# Patient Record
Sex: Female | Born: 1985 | Race: Black or African American | Hispanic: No | Marital: Single | Smoking: Never smoker
Health system: Southern US, Community
[De-identification: ages and names within clinical notes are randomized; demographics above are authoritative.]

## PROBLEM LIST (undated history)

## (undated) HISTORY — PX: FOOT SURGERY: SHX648

## (undated) HISTORY — PX: BREAST BIOPSY: SHX20

## (undated) HISTORY — PX: TONSILLECTOMY: SUR1361

---

## 2004-12-26 ENCOUNTER — Ambulatory Visit (HOSPITAL_COMMUNITY): Admission: RE | Admit: 2004-12-26 | Discharge: 2004-12-26 | Payer: Self-pay | Admitting: Obstetrics and Gynecology

## 2004-12-26 IMAGING — US US OB COMP +14 WK
1 series · 13 of 28 positions shown · non-contrast
Comparison: none

CLINICAL DATA: Anatomic exam.  No current problems.

[Series 1: us ob comp +14 wk · 0.33mm/px · 13 of 72 slices shown]
[im 3/72]
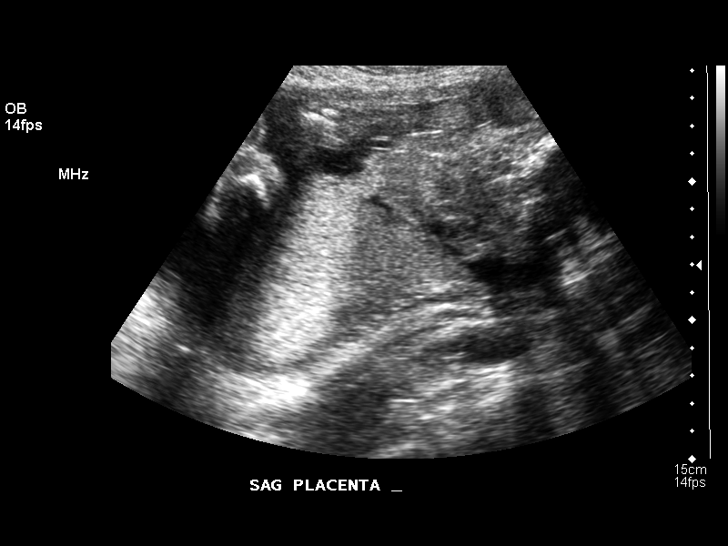
[im 8/72]
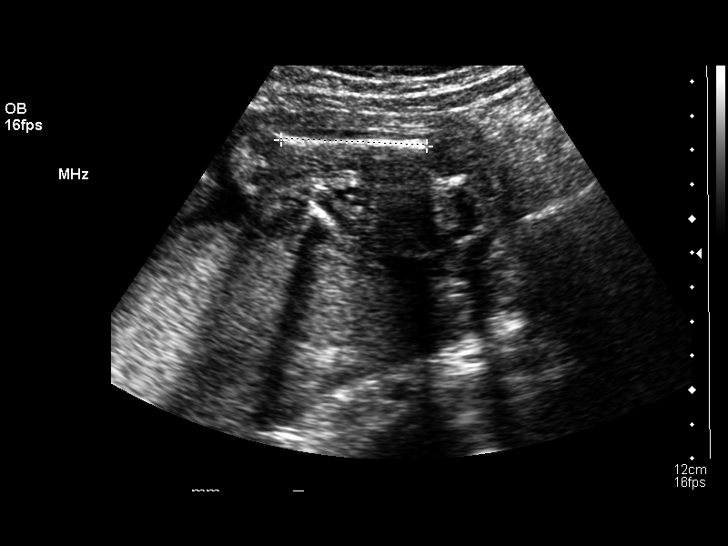
[im 14/72]
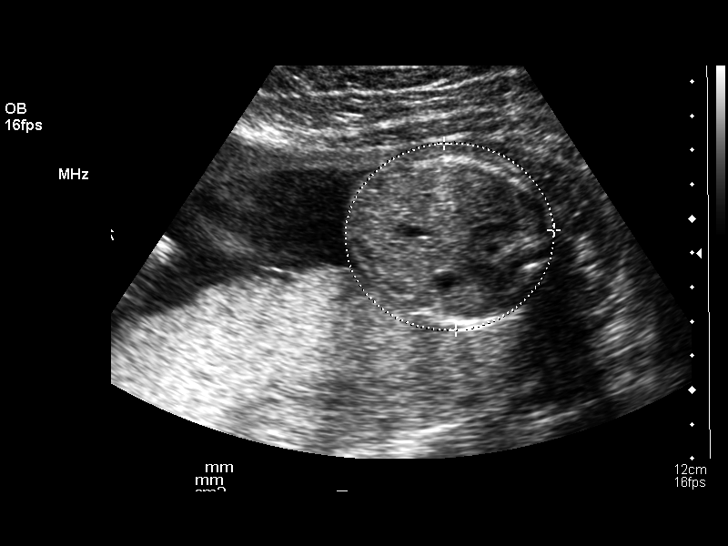
[im 19/72]
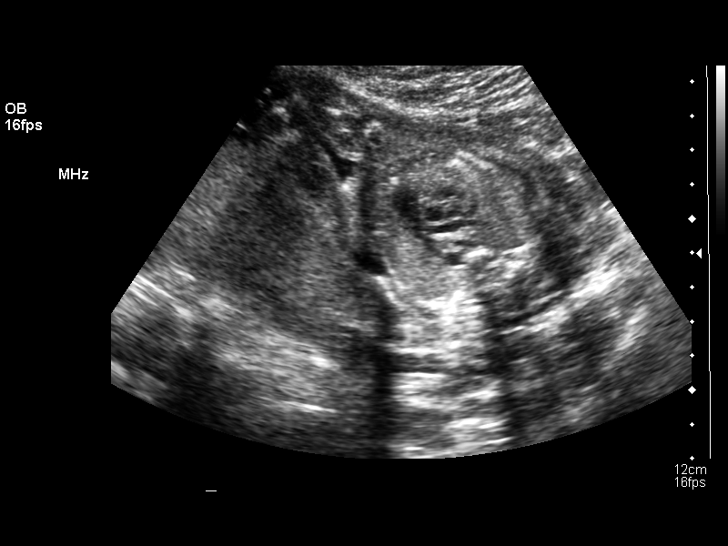
[im 24/72]
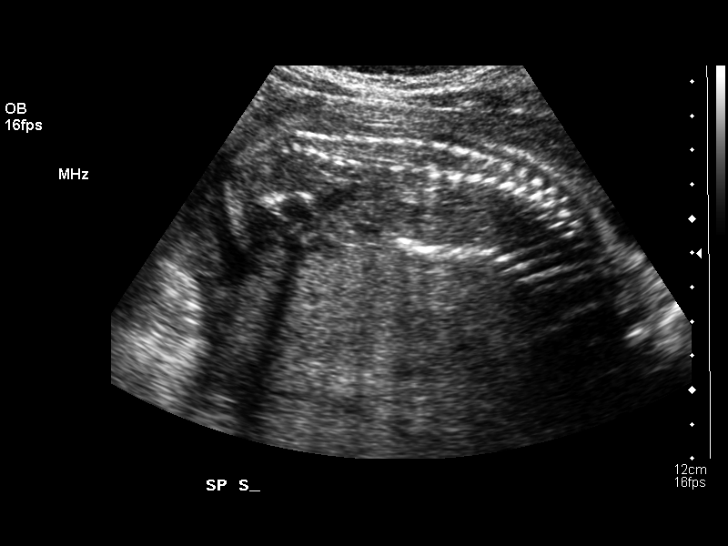
[im 29/72]
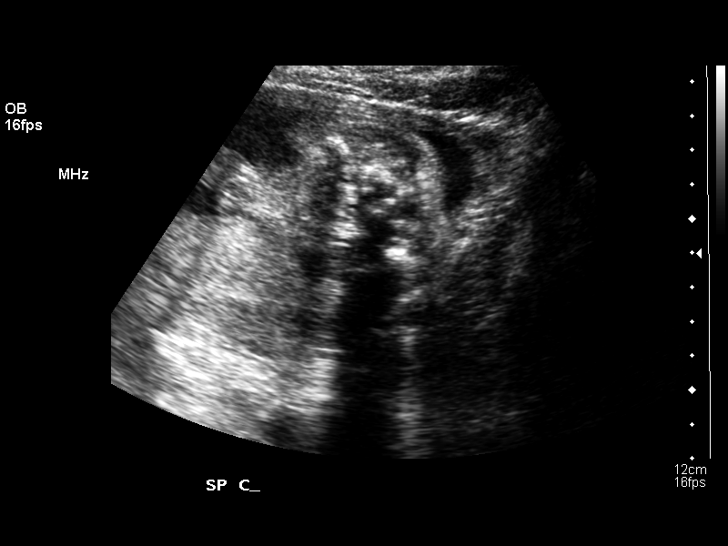
[im 37/72]
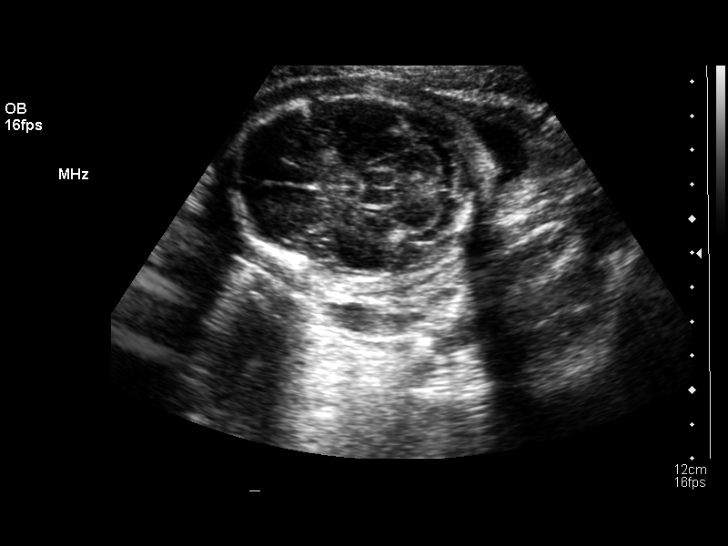
[im 43/72]
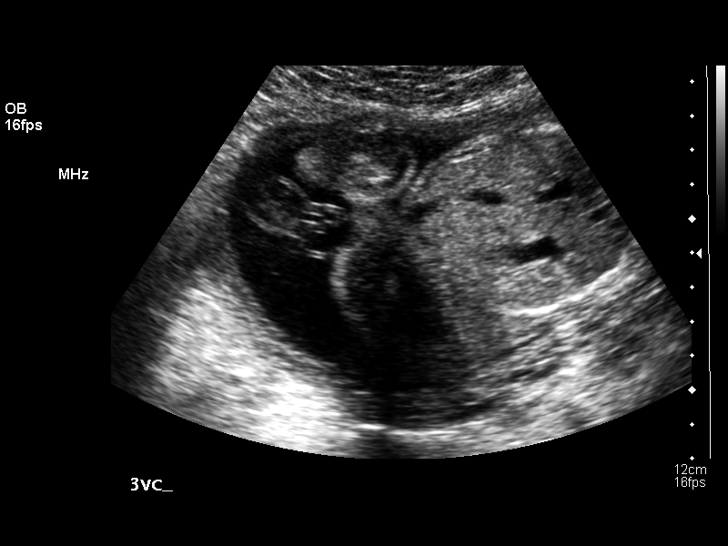
[im 48/72]
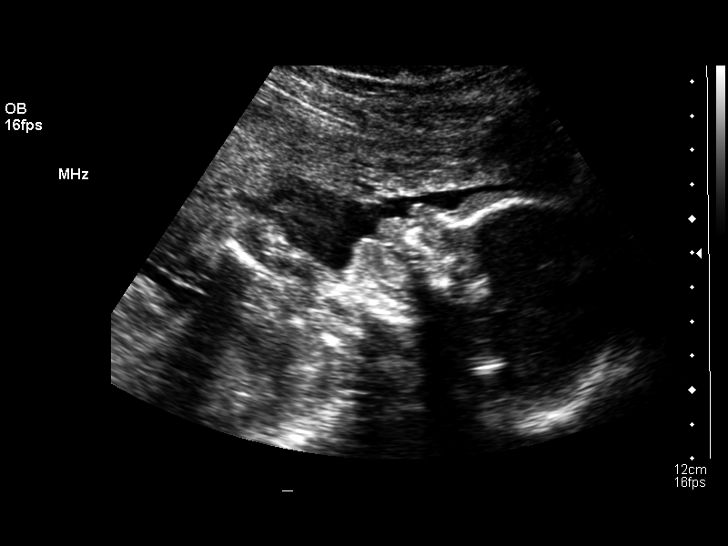
[im 53/72]
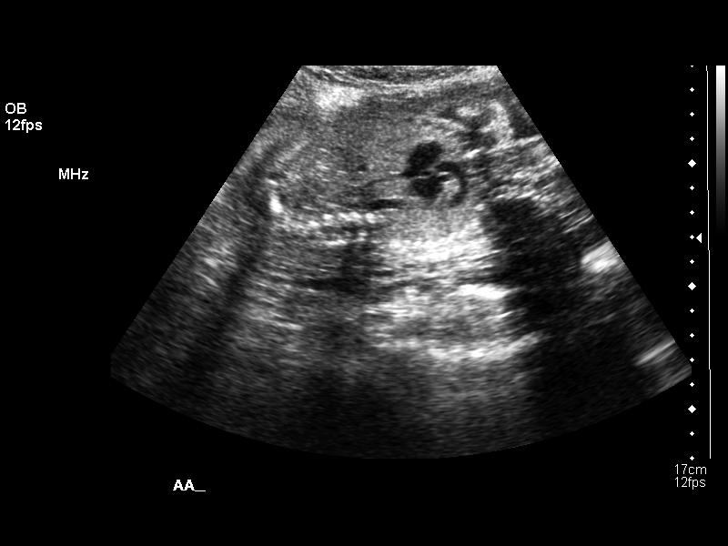
[im 58/72]
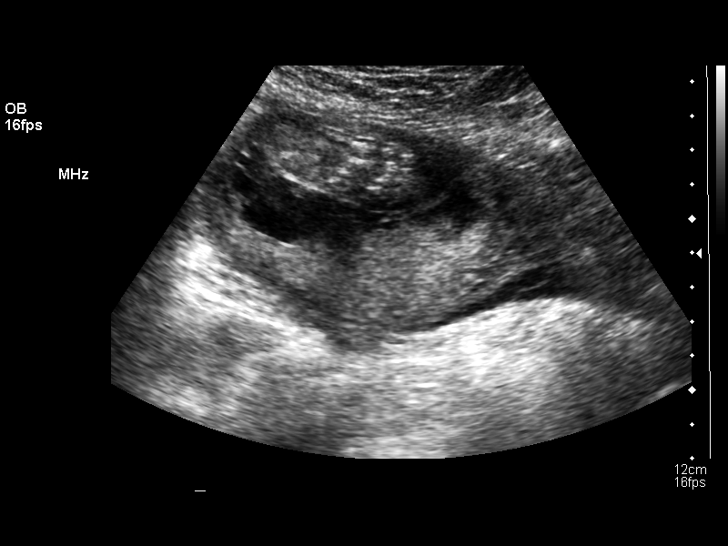
[im 64/72]
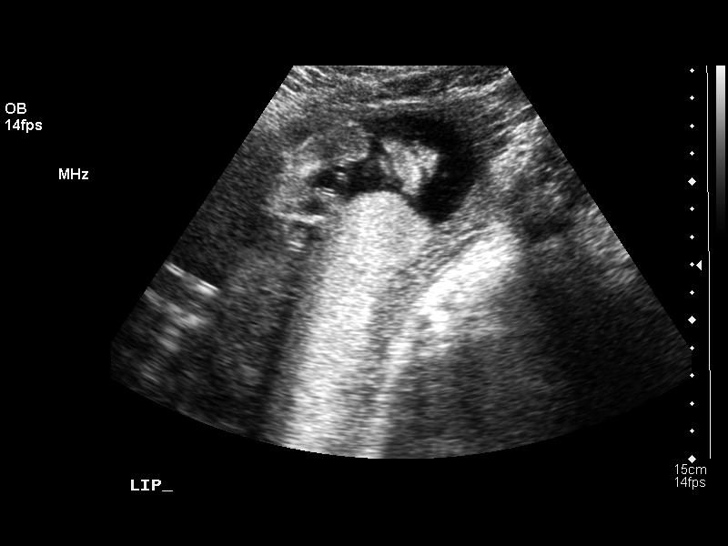
[im 69/72]
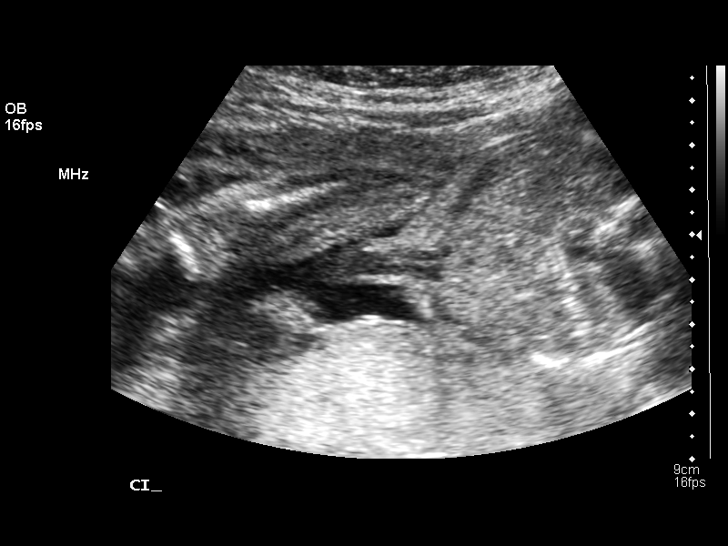

[13 of 28 positions shown; findings below may reference images not displayed]

OBSTETRICAL ULTRASOUND:
Number of Fetuses: 1
Heart Rate:  144
Movement:  Yes
Breathing:  No  
Presentation:  Cephalic
Placental Location:  Posterior
Grade:  I
Previa:  No
Amniotic Fluid (Subjective):  Normal
Amniotic Fluid (Objective):   3.2 cm Vertical pocket 

FETAL BIOMETRY
BPD:  5.6 cm   23 w 0 d
HC:  21.3 cm   23 w 2 d
AC:  18.5 cm   23 w 2 d
FL:    4.2 cm   23 w 5 d

MEAN GA:  23 w 2 d

FETAL ANATOMY
Lateral Ventricles:    Visualized 
Thalami/CSP:      Visualized 
Posterior Fossa:  Visualized 
Nuchal Region:    N/A
Spine:      Visualized 
4 Chamber Heart on Left:      Visualized 
Stomach on Left:      Visualized 
3 Vessel Cord:    Visualized 
Cord Insertion site:    Visualized 
Kidneys:  Visualized 
Bladder:  Visualized 
Extremities:      Visualized 

ADDITIONAL ANATOMY VISUALIZED:  LVOT, RVOT, upper lip, orbits, profile, diaphragm, 5th digit, ductal arch, aortic arch, and male genitalia.

MATERNAL UTERINE AND ADNEXAL FINDINGS
Cervix:   4.0 cm Transabdominally
IMPRESSION: 1.  Single intrauterine pregnancy demonstrating an estimated gestational age by ultrasound of 23 weeks and 2 days.  Correlation with assigned gestational age by office exam of 22 weeks and 3 days suggests appropriate growth.  
2.  No focal fetal or placental abnormalities are noted with a good anatomic exam possible.  Only the fetal heel could not be assessed due to positioning of the feet against the uterine wall.  
3.  Subjectively and quantitatively normal amniotic fluid volume and normal cervical length.

## 2014-03-03 ENCOUNTER — Emergency Department (HOSPITAL_BASED_OUTPATIENT_CLINIC_OR_DEPARTMENT_OTHER)
Admission: EM | Admit: 2014-03-03 | Discharge: 2014-03-03 | Disposition: A | Discharge status: Home / Self Care | Payer: BC Managed Care – PPO | Attending: Emergency Medicine | Admitting: Emergency Medicine

## 2014-03-03 ENCOUNTER — Encounter (HOSPITAL_BASED_OUTPATIENT_CLINIC_OR_DEPARTMENT_OTHER): Payer: Self-pay | Admitting: Emergency Medicine

## 2014-03-03 DIAGNOSIS — Z3202 Encounter for pregnancy test, result negative: Secondary | ICD-10-CM | POA: Insufficient documentation

## 2014-03-03 DIAGNOSIS — Z880 Allergy status to penicillin: Secondary | ICD-10-CM | POA: Insufficient documentation

## 2014-03-03 DIAGNOSIS — Z9889 Other specified postprocedural states: Secondary | ICD-10-CM | POA: Insufficient documentation

## 2014-03-03 DIAGNOSIS — R300 Dysuria: Secondary | ICD-10-CM | POA: Diagnosis present

## 2014-03-03 DIAGNOSIS — M545 Low back pain, unspecified: Secondary | ICD-10-CM | POA: Diagnosis not present

## 2014-03-03 DIAGNOSIS — N390 Urinary tract infection, site not specified: Secondary | ICD-10-CM

## 2014-03-03 DIAGNOSIS — R110 Nausea: Secondary | ICD-10-CM | POA: Insufficient documentation

## 2014-03-03 LAB — URINALYSIS, ROUTINE W REFLEX MICROSCOPIC
BILIRUBIN URINE: NEGATIVE
GLUCOSE, UA: NEGATIVE mg/dL
KETONES UR: NEGATIVE mg/dL
NITRITE: NEGATIVE
Protein, ur: NEGATIVE mg/dL
SPECIFIC GRAVITY, URINE: 1.026 (ref 1.005–1.030)
UROBILINOGEN UA: 1 mg/dL (ref 0.0–1.0)
pH: 6 (ref 5.0–8.0)

## 2014-03-03 LAB — URINE MICROSCOPIC-ADD ON

## 2014-03-03 LAB — PREGNANCY, URINE: Preg Test, Ur: NEGATIVE

## 2014-03-03 MED ORDER — PHENAZOPYRIDINE HCL 200 MG PO TABS: 200.0000 mg | ORAL_TABLET | Freq: Three times a day (TID) | ORAL | Status: DC | PRN

## 2014-03-03 MED ORDER — ONDANSETRON HCL 4 MG PO TABS: 4.0000 mg | ORAL_TABLET | Freq: Four times a day (QID) | ORAL | Status: DC

## 2014-03-03 MED ORDER — CIPROFLOXACIN HCL 500 MG PO TABS: 500.0000 mg | ORAL_TABLET | Freq: Two times a day (BID) | ORAL | Status: DC

## 2014-03-03 MED ORDER — CIPROFLOXACIN HCL 500 MG PO TABS
500.0000 mg | ORAL_TABLET | Freq: Once | ORAL | Status: AC
  Administered 2014-03-03: 500 mg via ORAL
  Filled 2014-03-03: qty 1

## 2014-03-03 NOTE — ED Provider Notes (Signed)
CSN: [id number]     Arrival date & time 03/03/14  1958 History   This chart was scribed for Alicia Sheffield, MD, by Yevette Edwards, ED Scribe. This patient was seen in room MH04/MH04 and the patient's care was started at 9:10 PM.  First MD Initiated Contact with Patient 03/03/14 2110     Chief Complaint  Patient presents with  . Dysuria    Patient is a 28 y.o. female presenting with back pain. The history is provided by the patient. No language interpreter was used.  Back Pain Location:  Lumbar spine Quality:  Unable to specify Radiates to:  Does not radiate Pain severity:  Moderate (6/10) Onset quality:  Gradual Duration:  5 days Progression:  Worsening Chronicity:  New Relieved by:  Nothing Worsened by:  Nothing tried Ineffective treatments:  None tried Associated symptoms: dysuria   Associated symptoms: no abdominal pain, no chest pain, no fever and no headaches     HPI Comments: PAYDEN BONUS is a 28 y.o. female who presents to the Emergency Department complaining of five days of left-sided lumbar pain which has been associated with malodorous urine, urgency, frequency, and hesitancy. She also endorses nausea. She denies fever, chills, or emesis. Ms. Zahradnik reports a h/o UTI.   History reviewed. No pertinent past medical history. Past Surgical History  Procedure Laterality Date  . Cesarean section    . Breast biopsy    . Foot surgery     No family history on file. History  Substance Use Topics  . Smoking status: Never Smoker   . Smokeless tobacco: Not on file  . Alcohol Use: No   No OB history provided.  Review of Systems  Constitutional: Negative for fever and fatigue.  HENT: Negative for congestion and drooling.   Eyes: Negative for pain.  Respiratory: Negative for cough and shortness of breath.   Cardiovascular: Negative for chest pain.  Gastrointestinal: Positive for nausea. Negative for vomiting, abdominal pain and diarrhea.  Genitourinary: Positive for  dysuria, urgency and frequency. Negative for hematuria.  Musculoskeletal: Positive for back pain. Negative for neck pain.  Skin: Negative for color change.  Neurological: Negative for dizziness and headaches.  Hematological: Negative for adenopathy.  Psychiatric/Behavioral: Negative for behavioral problems.  All other systems reviewed and are negative.     Allergies  Penicillins  Home Medications   Prior to Admission medications   Not on File   Triage Vitals: BP 137/86  Pulse 89  Temp(Src) 99.2 F (37.3 C) (Oral)  Ht  (1.575 m)  Wt 175 lb (79.379 kg)  BMI 32.00 kg/m2  SpO2 100%  LMP 02/28/2014  Physical Exam  Nursing note and vitals reviewed. Constitutional: She appears well-developed and well-nourished. No distress.  HENT:  Head: Normocephalic and atraumatic.  Mouth/Throat: Oropharynx is clear and moist. No oropharyngeal exudate.  Eyes: Conjunctivae and EOM are normal. Pupils are equal, round, and reactive to light. Right eye exhibits no discharge. Left eye exhibits no discharge. No scleral icterus.  Neck: Normal range of motion. Neck supple. No JVD present. No thyromegaly present.  Cardiovascular: Normal rate, regular rhythm, normal heart sounds and intact distal pulses.  Exam reveals no gallop and no friction rub.   No murmur heard. Pulmonary/Chest: Effort normal and breath sounds normal. No respiratory distress. She has no wheezes. She has no rales.  Abdominal: Soft. Bowel sounds are normal. She exhibits no distension and no mass. There is no tenderness.  Musculoskeletal: Normal range of motion. She exhibits tenderness.  She exhibits no edema.  Mild left CVA TTP.   Lymphadenopathy:    She has no cervical adenopathy.  Neurological: She is alert. Coordination normal.  Skin: Skin is warm and dry. No rash noted. No erythema.  Psychiatric: She has a normal mood and affect. Her behavior is normal.    ED Course  Procedures (including critical care  time)  DIAGNOSTIC STUDIES: Oxygen Saturation is 100% on room air, normal by my interpretation.    COORDINATION OF CARE:  9:17 PM- Discussed treatment plan with patient, and the patient agreed to the plan. The plan includes Cipro and pyridium.   Labs Review Labs Reviewed  URINALYSIS, ROUTINE W REFLEX MICROSCOPIC - Abnormal; Notable for the following:    APPearance CLOUDY (*)    Hgb urine dipstick SMALL (*)    Leukocytes, UA MODERATE (*)    All other components within normal limits  URINE MICROSCOPIC-ADD ON - Abnormal; Notable for the following:    Squamous Epithelial / LPF FEW (*)    Bacteria, UA MANY (*)    All other components within normal limits  URINE CULTURE  PREGNANCY, URINE    Imaging Review No results found.   EKG Interpretation None      MDM   Final diagnoses:  UTI (lower urinary tract infection)     28 y.o. female here w/ urinary freq and left lower back pain. UA w/ evidence of uti. Pt denies vomiting, fever. Pain controlled w/ otc meds. Will tx w/ cipro.     I have discussed the diagnosis/risks/treatment options with the patient and family and believe the pt to be eligible for discharge home to follow-up with her pcp as needed. We also discussed returning to the ED immediately if new or worsening sx occur. We discussed the sx which are most concerning (e.g., inability to tolerate the abx, inc vomiting, inc pain, fever) that necessitate immediate return. Medications administered to the patient during their visit and any new prescriptions provided to the patient are listed below.  Medications given during this visit Medications  ciprofloxacin (CIPRO) tablet 500 mg (500 mg Oral Given 03/03/14 2124)    Discharge Medication List as of 03/03/2014  9:19 PM    START taking these medications   Details  ciprofloxacin (CIPRO) 500 MG tablet Take 1 tablet (500 mg total) by mouth 2 (two) times daily. One po bid x 7 days, Starting 03/03/2014, Until Discontinued, Print     ondansetron (ZOFRAN) 4 MG tablet Take 1 tablet (4 mg total) by mouth every 6 (six) hours., Starting 03/03/2014, Until Discontinued, Print    phenazopyridine (PYRIDIUM) 200 MG tablet Take 1 tablet (200 mg total) by mouth 3 (three) times daily as needed for pain., Starting 03/03/2014, Until Discontinued, Print         I personally performed the services described in this documentation, which was scribed in my presence. The recorded information has been reviewed and is accurate.     Alicia Sheffield, MD 03/04/14 1047

## 2014-03-03 NOTE — ED Notes (Signed)
C/o back, odor in urine increased urinary freq x 6 days

## 2014-03-03 NOTE — ED Notes (Signed)
C/o dysuria, cloudy and foul smelling urine x 6 days

## 2014-03-06 LAB — URINE CULTURE
Colony Count: >=100000
SPECIAL REQUESTS: NORMAL

## 2014-03-07 ENCOUNTER — Telehealth (HOSPITAL_BASED_OUTPATIENT_CLINIC_OR_DEPARTMENT_OTHER): Payer: Self-pay | Admitting: Emergency Medicine

## 2014-03-07 NOTE — Telephone Encounter (Signed)
Post ED Visit - Positive Culture Follow-up  Culture report reviewed by antimicrobial stewardship pharmacist:  Wes Dulaney, Pharm.D., BCPS  Celedonio Miyamoto, Pharm.D., BCPS  Georgina Pillion, [address].D., BCPS  [place], [address].D., BCPS, AAHIVP  Estella Husk, Pharm.D., BCPS, AAHIVP  Carly Sabat, Pharm.D.  Enzo Bi, [address].D.  Positive urine culture >100,000 colonies/ml E. Coli Treated with cipro  bid x 7 days, organism sensitive to the same and no further patient follow-up is required at this time.  Berle Mull 03/07/2014, 1:47 PM

## 2015-03-01 ENCOUNTER — Emergency Department (HOSPITAL_BASED_OUTPATIENT_CLINIC_OR_DEPARTMENT_OTHER): Payer: BC Managed Care – PPO

## 2015-03-01 ENCOUNTER — Encounter (HOSPITAL_BASED_OUTPATIENT_CLINIC_OR_DEPARTMENT_OTHER): Payer: Self-pay | Admitting: *Deleted

## 2015-03-01 ENCOUNTER — Emergency Department (HOSPITAL_BASED_OUTPATIENT_CLINIC_OR_DEPARTMENT_OTHER)
Admission: EM | Admit: 2015-03-01 | Discharge: 2015-03-01 | Disposition: A | Discharge status: Home / Self Care | Payer: BC Managed Care – PPO | Attending: Emergency Medicine | Admitting: Emergency Medicine

## 2015-03-01 DIAGNOSIS — Z880 Allergy status to penicillin: Secondary | ICD-10-CM | POA: Diagnosis not present

## 2015-03-01 DIAGNOSIS — Z3202 Encounter for pregnancy test, result negative: Secondary | ICD-10-CM | POA: Insufficient documentation

## 2015-03-01 DIAGNOSIS — Z9889 Other specified postprocedural states: Secondary | ICD-10-CM | POA: Diagnosis not present

## 2015-03-01 DIAGNOSIS — K219 Gastro-esophageal reflux disease without esophagitis: Secondary | ICD-10-CM | POA: Diagnosis not present

## 2015-03-01 DIAGNOSIS — R002 Palpitations: Secondary | ICD-10-CM | POA: Diagnosis present

## 2015-03-01 LAB — BASIC METABOLIC PANEL
ANION GAP: 6 (ref 5–15)
BUN: 13 mg/dL (ref 6–20)
CO2: 25 mmol/L (ref 22–32)
Calcium: 9.2 mg/dL (ref 8.9–10.3)
Chloride: 107 mmol/L (ref 101–111)
Creatinine, Ser: 0.93 mg/dL (ref 0.44–1.00)
GFR calc Af Amer: >60 mL/min (ref >60)
GLUCOSE: 99 mg/dL (ref 65–99)
POTASSIUM: 3.5 mmol/L (ref 3.5–5.1)
Sodium: 138 mmol/L (ref 135–145)

## 2015-03-01 LAB — CBC WITH DIFFERENTIAL/PLATELET
BASOS ABS: 0 10*3/uL (ref 0.0–0.1)
Basophils Relative: 0 %
EOS PCT: 1 %
Eosinophils Absolute: 0.1 10*3/uL (ref 0.0–0.7)
HEMATOCRIT: 35.3 % — AB (ref 36.0–46.0)
Hemoglobin: 11.6 g/dL — ABNORMAL LOW (ref 12.0–15.0)
LYMPHS ABS: 2.2 10*3/uL (ref 0.7–4.0)
LYMPHS PCT: 39 %
MCH: 27.8 pg (ref 26.0–34.0)
MCHC: 32.9 g/dL (ref 30.0–36.0)
MCV: 84.7 fL (ref 78.0–100.0)
MONO ABS: 0.3 10*3/uL (ref 0.1–1.0)
Monocytes Relative: 5 %
NEUTROS ABS: 3.1 10*3/uL (ref 1.7–7.7)
Neutrophils Relative %: 55 %
PLATELETS: 273 10*3/uL (ref 150–400)
RBC: 4.17 MIL/uL (ref 3.87–5.11)
RDW: 13 % (ref 11.5–15.5)
WBC: 5.7 10*3/uL (ref 4.0–10.5)

## 2015-03-01 LAB — TROPONIN I: Troponin I: <0.03 ng/mL (ref <0.031)

## 2015-03-01 LAB — D-DIMER, QUANTITATIVE: D-Dimer, Quant: 0.53 ug/mL-FEU — ABNORMAL HIGH (ref 0.00–0.48)

## 2015-03-01 LAB — PREGNANCY, URINE: Preg Test, Ur: NEGATIVE

## 2015-03-01 IMAGING — CR DG CHEST 2V
2 series · 2 of 2 positions shown · non-contrast
Comparison: None.

CLINICAL DATA: 29-year-old female with chest pain

EXAM:
CHEST  2 VIEW

[w chest pa]
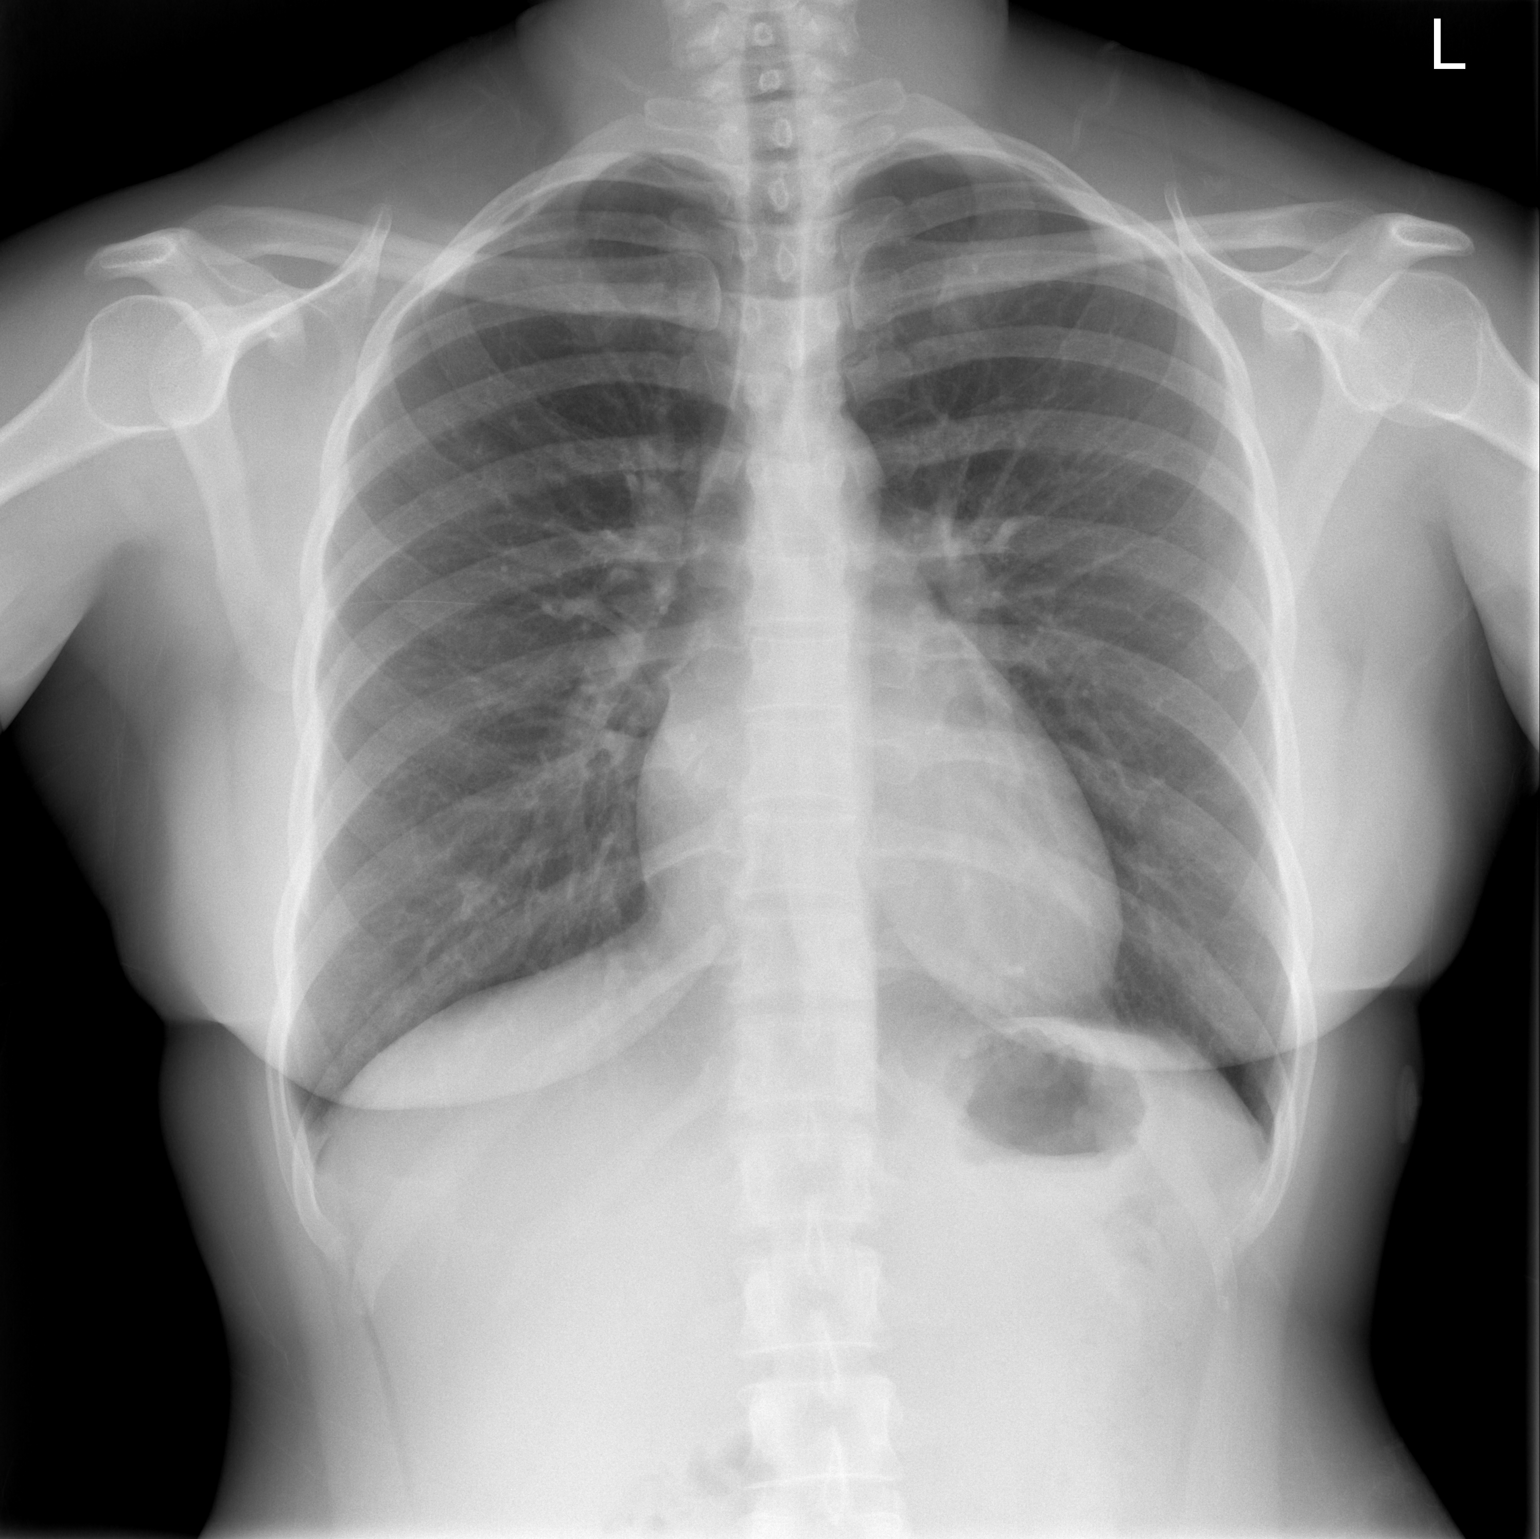

[w chest lat]
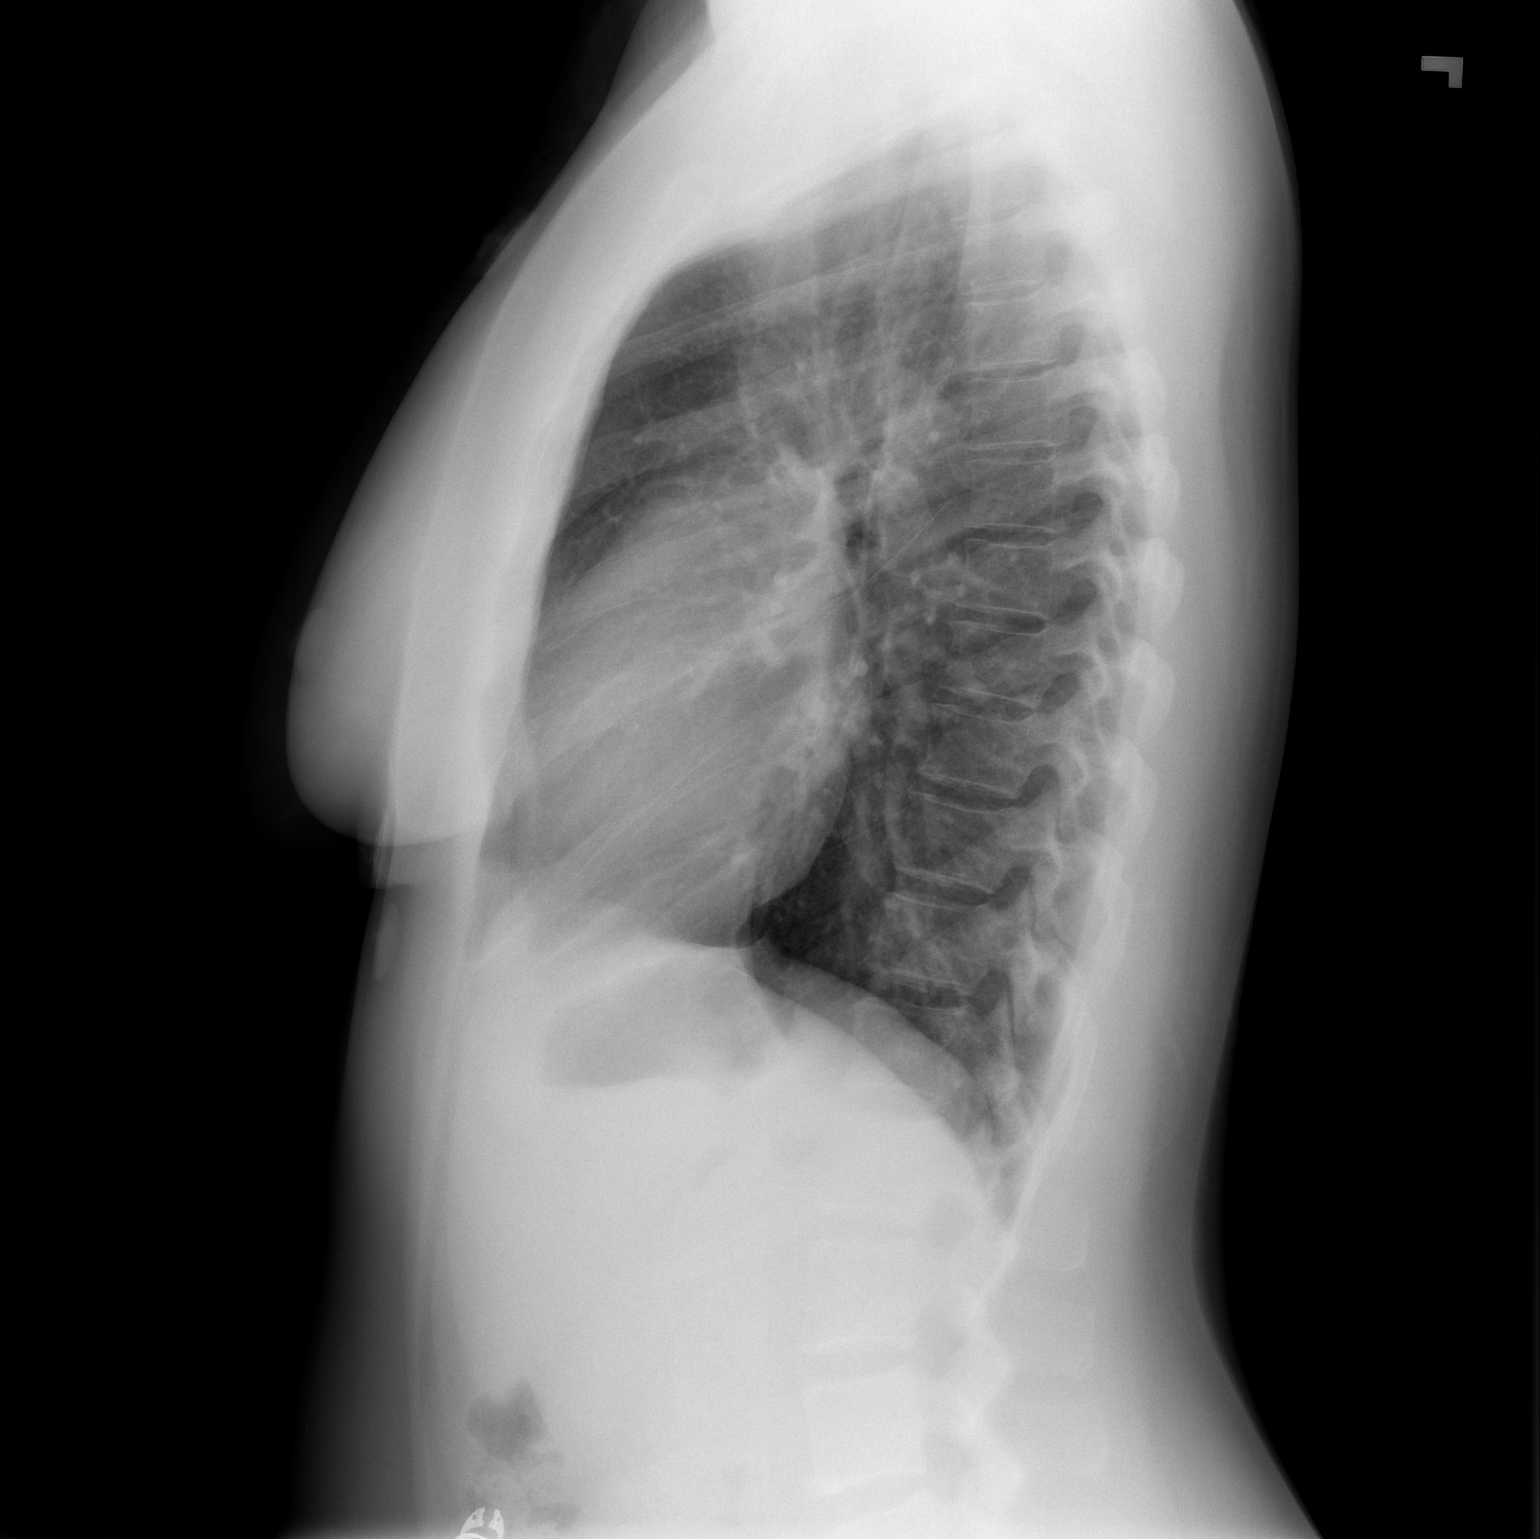

[2 of 2 positions shown; findings below may reference images not displayed]

FINDINGS: The heart size and mediastinal contours are within normal limits.
Both lungs are clear. The visualized skeletal structures are
unremarkable.
IMPRESSION: No active cardiopulmonary disease.

## 2015-03-01 IMAGING — CT CT ANGIO CHEST
2 of 6 series · 19 of 36 positions shown · IV contrast (omnipaque)
Comparison: Chest radiograph earlier this day.

CLINICAL DATA: Shortness of breath for 3 days.  Elevated D-dimer.

EXAM:
CT ANGIOGRAPHY CHEST WITH CONTRAST
TECHNIQUE: Multidetector CT imaging of the chest was performed using the
standard protocol during bolus administration of intravenous
contrast. Multiplanar CT image reconstructions and MIPs were
obtained to evaluate the vascular anatomy.
CONTRAST:  100mL OMNIPAQUE IOHEXOL 350 MG/ML SOLN

[Series 5: pe 1.0 b26f · axial · 0.61mm/px · z∈[+1078,+1294]mm · 18 of 242 slices shown]
[im 13/242  lung]
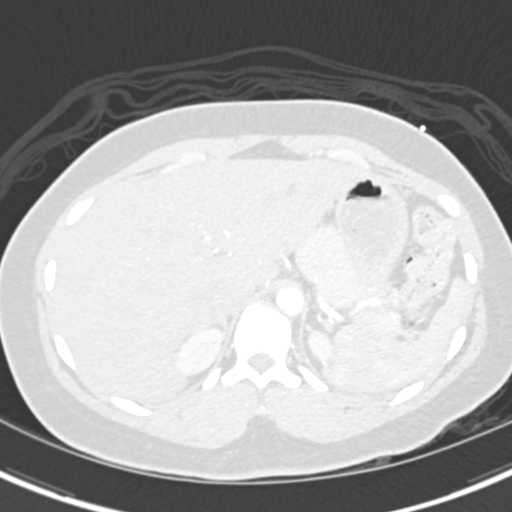
[im 25/242  mediastinal]
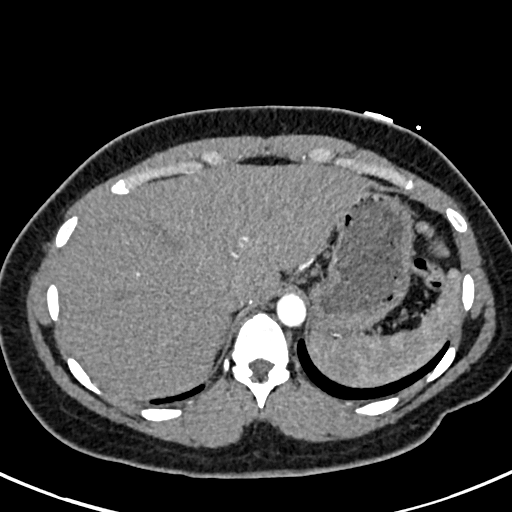
[im 37/242  lung]
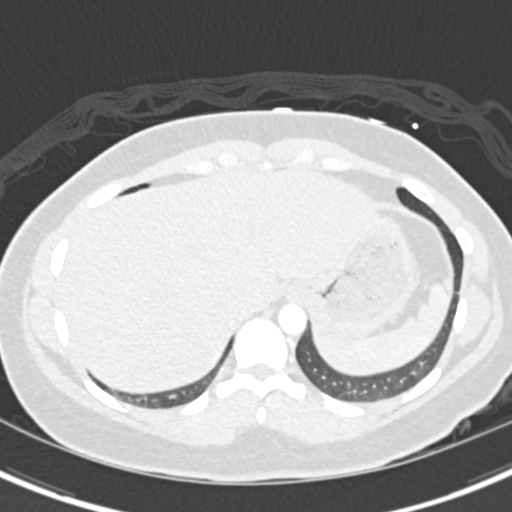
[im 49/242  mediastinal]
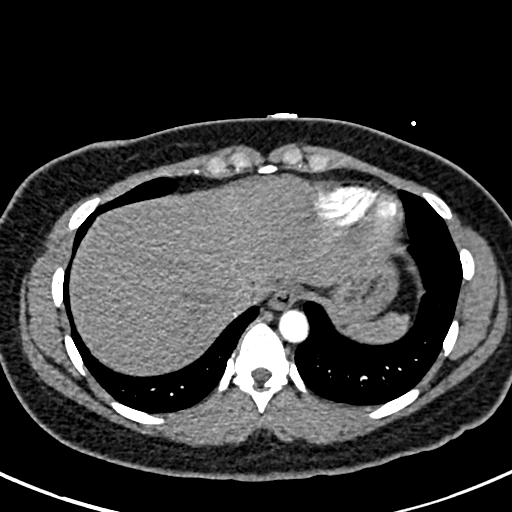
[im 61/242  lung]
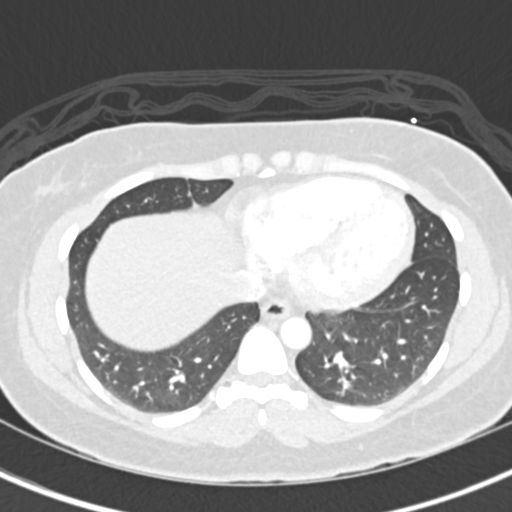
[im 73/242  mediastinal]
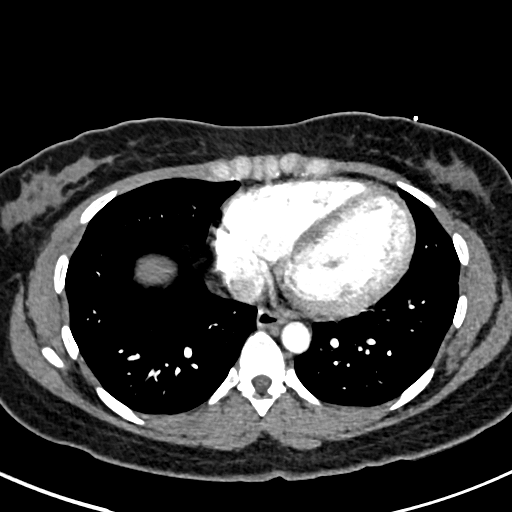
[im 85/242  lung]
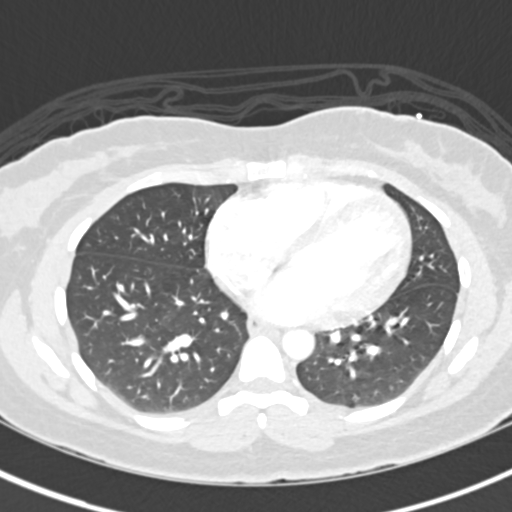
[im 97/242  mediastinal]
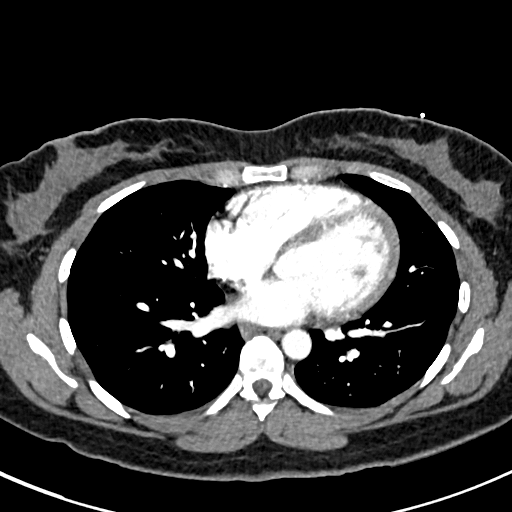
[im 109/242  lung]
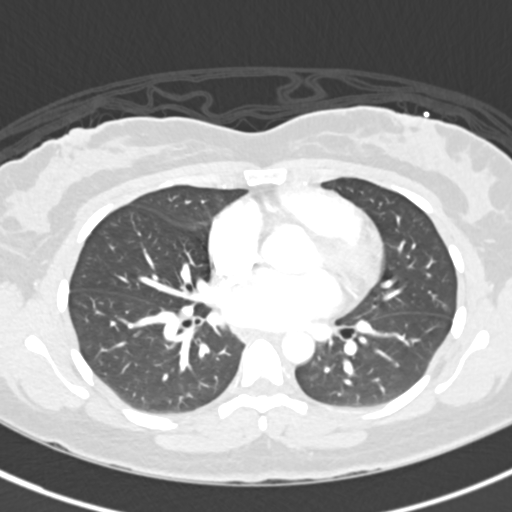
[im 133/242  mediastinal]
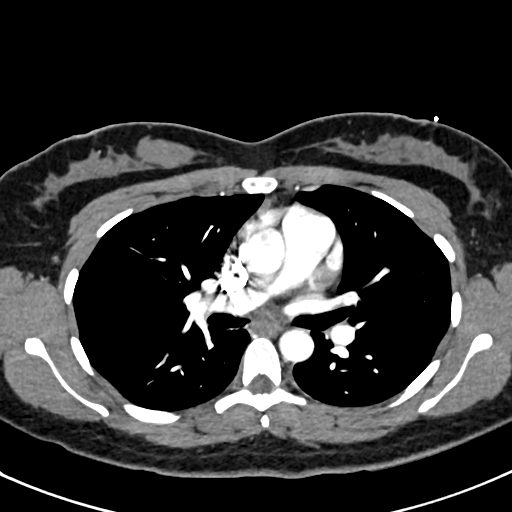
[im 145/242  lung]
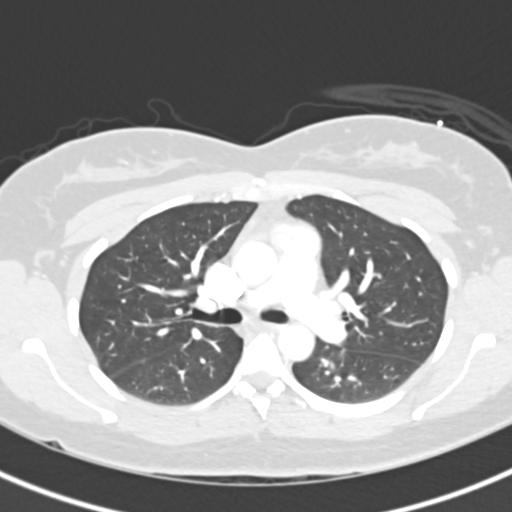
[im 157/242  mediastinal]
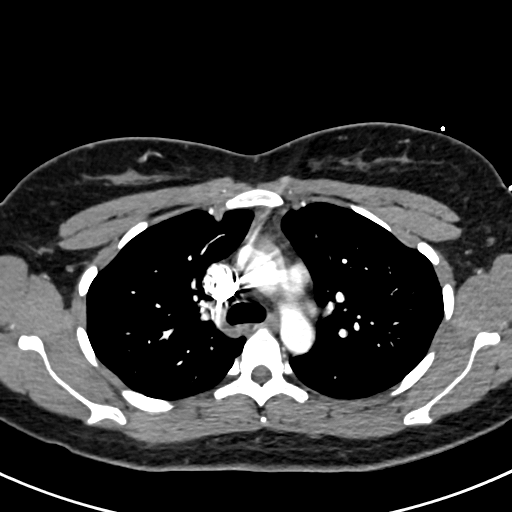
[im 169/242  lung]
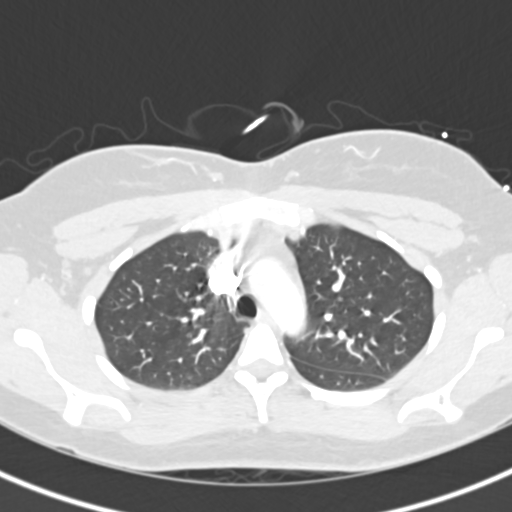
[im 181/242  mediastinal]
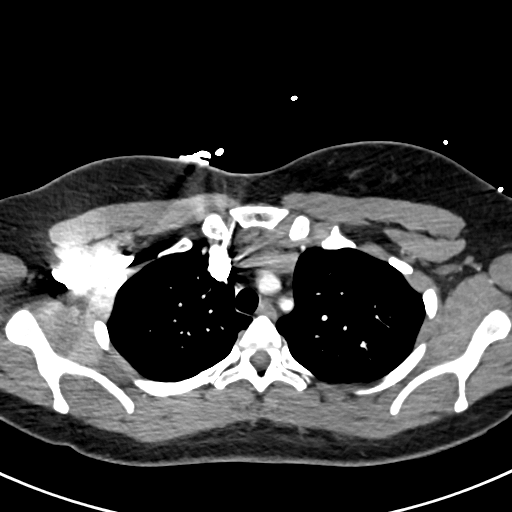
[im 193/242  lung]
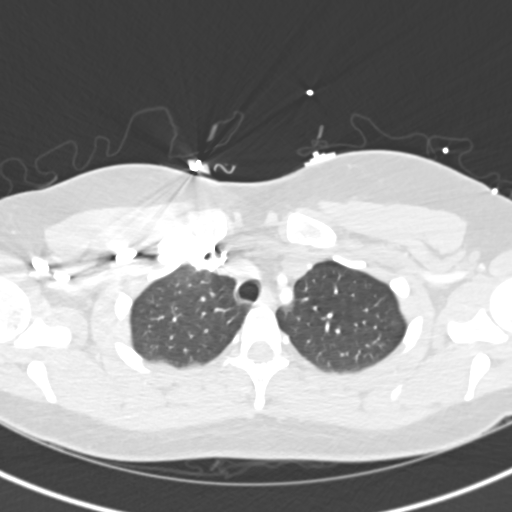
[im 205/242  mediastinal]
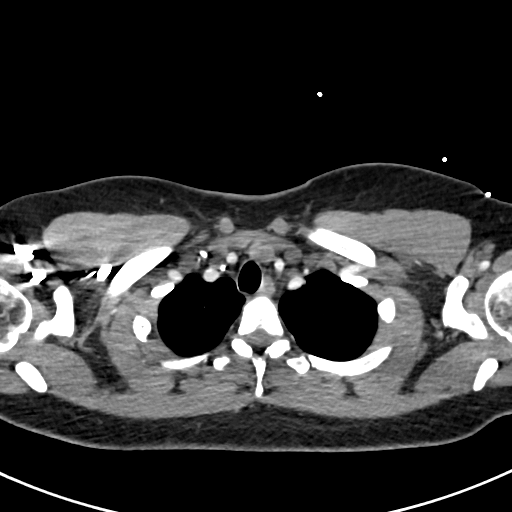
[im 217/242  lung]
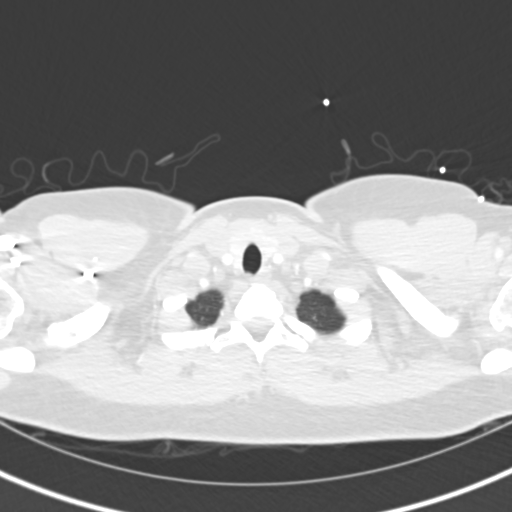
[im 229/242  mediastinal]
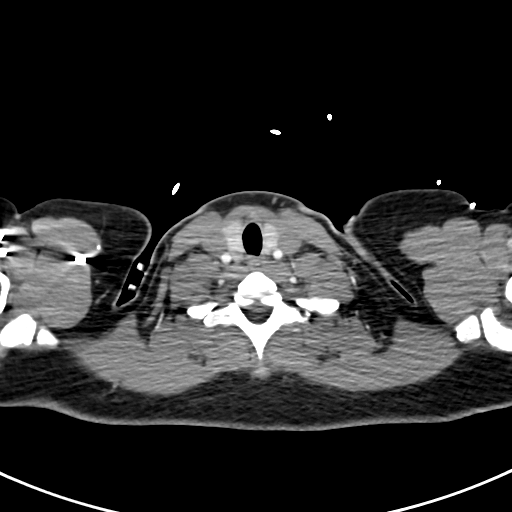

[Series 8: pe 2.0 coronal · coronal · 0.55mm/px · 1 of 114 slices shown]
[im 57/114  mediastinal]
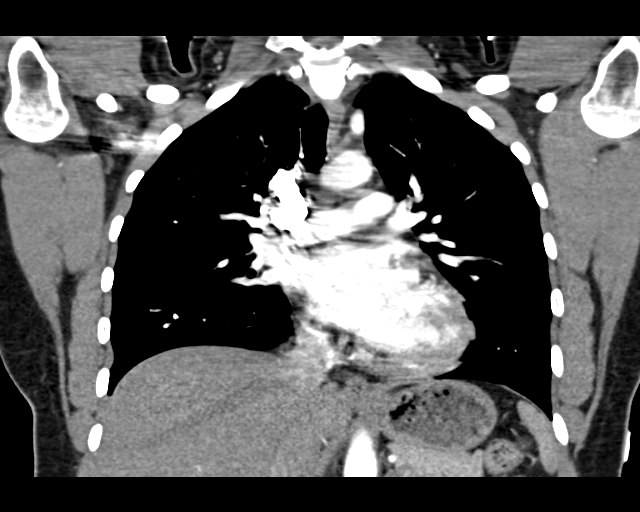

[19 of 36 positions shown; findings below may reference images not displayed]

FINDINGS: There are no filling defects within the pulmonary arteries to
suggest pulmonary embolus.

The thoracic aorta is normal in caliber. Conventional branching
pattern from the aortic arch. No mediastinal or hilar adenopathy.
Minimal soft tissue density in the anterior mediastinum is likely
thymus. There is no pleural or pericardial effusion.

Clear lungs without consolidation, pulmonary mass or suspicious
nodule. No findings of pulmonary edema.

No acute abnormality in the included upper abdomen. Rounded density
in the medial right breast, corresponding to fibroadenoma on prior
breast ultrasound from [DATE]. There are no acute or suspicious
osseous abnormalities.

Review of the MIP images confirms the above findings.
IMPRESSION: No pulmonary embolus or acute intrathoracic process.

## 2015-03-01 MED ORDER — IOHEXOL 350 MG/ML SOLN
100.0000 mL | Freq: Once | INTRAVENOUS | Status: AC | PRN
  Administered 2015-03-01: 100 mL via INTRAVENOUS

## 2015-03-01 MED ORDER — GI COCKTAIL ~~LOC~~
30.0000 mL | Freq: Once | ORAL | Status: AC
  Administered 2015-03-01: 30 mL via ORAL
  Filled 2015-03-01: qty 30

## 2015-03-01 MED ORDER — OMEPRAZOLE 20 MG PO CPDR: 20.0000 mg | DELAYED_RELEASE_CAPSULE | Freq: Every day | ORAL | Status: AC

## 2015-03-01 NOTE — ED Notes (Signed)
Pt states that earlier tonight she felt like she "could not take a deep breath," and felt like "I was suffocating."  Then she went to lie down and had two episodes of "something not normal with my heart.  It felt like a rocky road."  Pt denies chest pain, denies dizziness, denies excess caffeine intake, ate and drank like normal today and is only taking prenatal vitamins daily.  Pt also denies excess stress.  Lung sounds are clear, vital signs are WNL and pt is sinus rhythm on the monitor.

## 2015-03-01 NOTE — ED Notes (Signed)
Pt returned from xray

## 2015-03-01 NOTE — ED Notes (Signed)
Pt states she is feeling relief from GI cocktail

## 2015-03-01 NOTE — ED Notes (Signed)
Patient transported to X-ray 

## 2015-03-01 NOTE — ED Notes (Signed)
Pt c/o " palpitations" x 2 hrs

## 2015-03-01 NOTE — ED Notes (Signed)
Pt verbalizes understanding of d/c instructions and denies any further needs at this time. 

## 2015-03-01 NOTE — ED Notes (Signed)
Pt returned from CT °

## 2015-03-01 NOTE — ED Provider Notes (Addendum)
CSN: [id number]     Arrival date & time 03/01/15  0018 History   First MD Initiated Contact with Patient 03/01/15 0029     Chief Complaint  Patient presents with  . Palpitations     (Consider location/radiation/quality/duration/timing/severity/associated sxs/prior Treatment) Patient is a 29 y.o. female presenting with palpitations. The history is provided by the patient.  Palpitations Palpitations quality:  Regular Onset quality:  Gradual Duration:  4 days Timing:  Constant Progression:  Unchanged Chronicity:  New Context: not blood loss, not exercise and not stimulant use   Relieved by:  Nothing Worsened by:  Nothing Ineffective treatments:  None tried Associated symptoms: no chest pain, no cough, no near-syncope, no numbness, no PND, no shortness of breath, no syncope, no vomiting and no weakness   Risk factors: no hx of atrial fibrillation     History reviewed. No pertinent past medical history. Past Surgical History  Procedure Laterality Date  . Cesarean section    . Breast biopsy    . Foot surgery     History reviewed. No pertinent family history. Social History  Substance Use Topics  . Smoking status: Never Smoker   . Smokeless tobacco: None  . Alcohol Use: No   OB History    No data available     Review of Systems  Constitutional: Negative for fever.  Respiratory: Negative for cough and shortness of breath.   Cardiovascular: Positive for palpitations. Negative for chest pain, leg swelling, syncope, PND and near-syncope.  Gastrointestinal: Negative for vomiting.  Neurological: Negative for weakness and numbness.  All other systems reviewed and are negative.     Allergies  Penicillins  Home Medications   Prior to Admission medications   Not on File   BP 118/59 mmHg  Pulse 72  Temp(Src) 98.1 F (36.7 C) (Oral)  Resp 14  Ht  (1.575 m)  Wt 174 lb (78.926 kg)  BMI 31.82 kg/m2  SpO2 100%  LMP 01/31/2015 Physical Exam  Constitutional: She  is oriented to person, place, and time. She appears well-developed and well-nourished. No distress.  HENT:  Head: Normocephalic and atraumatic.  Mouth/Throat: Oropharynx is clear and moist.  Eyes: Conjunctivae and EOM are normal. Pupils are equal, round, and reactive to light.  Neck: Normal range of motion. Neck supple.  Cardiovascular: Normal rate, regular rhythm and intact distal pulses.   Pulmonary/Chest: Effort normal and breath sounds normal. No respiratory distress. She has no wheezes. She has no rales.  Abdominal: Soft. Bowel sounds are normal. There is no tenderness. There is no rebound and no guarding.  Musculoskeletal: Normal range of motion. She exhibits no edema or tenderness.  Neurological: She is alert and oriented to person, place, and time. She has normal reflexes.  Skin: Skin is warm and dry.  Psychiatric: She has a normal mood and affect.    ED Course  Procedures (including critical care time) Labs Review Labs Reviewed  CBC WITH DIFFERENTIAL/PLATELET - Abnormal; Notable for the following:    Hemoglobin 11.6 (*)    HCT 35.3 (*)    All other components within normal limits  D-DIMER, QUANTITATIVE (NOT AT [facility]) - Abnormal; Notable for the following:    D-Dimer, Quant 0.53 (*)    All other components within normal limits  PREGNANCY, URINE  BASIC METABOLIC PANEL  TROPONIN I    Imaging Review Dg Chest 2 View  03/01/2015   CLINICAL DATA:  29 year old female with chest pain  EXAM: CHEST  2 VIEW  COMPARISON:  None.  FINDINGS: The heart size and mediastinal contours are within normal limits. Both lungs are clear. The visualized skeletal structures are unremarkable.  IMPRESSION: No active cardiopulmonary disease.   Electronically Signed   By: Elgie Collard M.D.   On: 03/01/2015 01:51   Ct Angio Chest Pe W/cm &/or Wo Cm  03/01/2015   CLINICAL DATA:  Shortness of breath for 3 days.  Elevated D-dimer.  EXAM: CT ANGIOGRAPHY CHEST WITH CONTRAST  TECHNIQUE: Multidetector CT  imaging of the chest was performed using the standard protocol during bolus administration of intravenous contrast. Multiplanar CT image reconstructions and MIPs were obtained to evaluate the vascular anatomy.  CONTRAST:  OMNIPAQUE IOHEXOL 350 MG/ML SOLN  COMPARISON:  Chest radiograph earlier this day.  FINDINGS: There are no filling defects within the pulmonary arteries to suggest pulmonary embolus.  The thoracic aorta is normal in caliber. Conventional branching pattern from the aortic arch. No mediastinal or hilar adenopathy. Minimal soft tissue density in the anterior mediastinum is likely thymus. There is no pleural or pericardial effusion.  Clear lungs without consolidation, pulmonary mass or suspicious nodule. No findings of pulmonary edema.  No acute abnormality in the included upper abdomen. 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 There are no acute or suspicious osseous abnormalities.  Review of the MIP images confirms the above findings.  IMPRESSION: No pulmonary embolus or acute intrathoracic process.   Electronically Signed   By: Rubye Oaks M.D.   On: 03/01/2015 02:05   I have personally reviewed and evaluated these images and lab results as part of my medical decision-making.   EKG Interpretation   Date/Time:  Thursday March 01 2015 00:29:31 EDT Ventricular Rate:  82 PR Interval:  164 QRS Duration: 86 QT Interval:  384 QTC Calculation: 448 R Axis:   71 Text Interpretation:  Normal sinus rhythm Confirmed by [facility]  MD,  Airik Goodlin ([id number]) on 03/01/2015 12:29:44 AM      MDM   Final diagnoses:  None   Exam and vitals are benign and reassuring.  No distress in room.  Resting comfortably Ruled out for MI based on ongoing symptoms with normal troponin and EKG in the setting of ongoing symptoms of > 8 hours duration consecutively.  Ruled out for PE.  I can find no explanation for the  patient's symptoms.  She is not tachycardiac.  Symptoms improved with GI cocktail, will treat for GERD.  Follow up with your PMD.      Nyjah Schwake, MD 03/01/15 0214  Gabi Mcfate, MD 03/01/15 0214  Kiyoto Slomski, MD 03/01/15 [phone]

## 2015-03-01 NOTE — ED Notes (Signed)
Patient transported to CT 

## 2017-04-04 ENCOUNTER — Emergency Department (HOSPITAL_BASED_OUTPATIENT_CLINIC_OR_DEPARTMENT_OTHER)
Admission: EM | Admit: 2017-04-04 | Discharge: 2017-04-04 | Disposition: A | Discharge status: Home / Self Care | Payer: BC Managed Care – PPO | Attending: Emergency Medicine | Admitting: Emergency Medicine

## 2017-04-04 ENCOUNTER — Encounter (HOSPITAL_BASED_OUTPATIENT_CLINIC_OR_DEPARTMENT_OTHER): Payer: Self-pay

## 2017-04-04 DIAGNOSIS — Z79899 Other long term (current) drug therapy: Secondary | ICD-10-CM | POA: Insufficient documentation

## 2017-04-04 DIAGNOSIS — R1030 Lower abdominal pain, unspecified: Secondary | ICD-10-CM | POA: Diagnosis not present

## 2017-04-04 DIAGNOSIS — Z793 Long term (current) use of hormonal contraceptives: Secondary | ICD-10-CM | POA: Insufficient documentation

## 2017-04-04 DIAGNOSIS — N921 Excessive and frequent menstruation with irregular cycle: Secondary | ICD-10-CM | POA: Insufficient documentation

## 2017-04-04 LAB — BASIC METABOLIC PANEL
Anion gap: 6 (ref 5–15)
BUN: 10 mg/dL (ref 6–20)
CALCIUM: 9.3 mg/dL (ref 8.9–10.3)
CO2: 25 mmol/L (ref 22–32)
CREATININE: 0.76 mg/dL (ref 0.44–1.00)
Chloride: 106 mmol/L (ref 101–111)
GFR calc Af Amer: >60 mL/min (ref >60)
GLUCOSE: 89 mg/dL (ref 65–99)
Potassium: 3.7 mmol/L (ref 3.5–5.1)
SODIUM: 137 mmol/L (ref 135–145)

## 2017-04-04 LAB — CBC WITH DIFFERENTIAL/PLATELET
BASOS ABS: 0 10*3/uL (ref 0.0–0.1)
BASOS PCT: 0 %
EOS PCT: 1 %
Eosinophils Absolute: 0.1 10*3/uL (ref 0.0–0.7)
HEMATOCRIT: 38.3 % (ref 36.0–46.0)
Hemoglobin: 12.9 g/dL (ref 12.0–15.0)
Lymphocytes Relative: 35 %
Lymphs Abs: 1.6 10*3/uL (ref 0.7–4.0)
MCH: 28.7 pg (ref 26.0–34.0)
MCHC: 33.7 g/dL (ref 30.0–36.0)
MCV: 85.1 fL (ref 78.0–100.0)
MONO ABS: 0.2 10*3/uL (ref 0.1–1.0)
MONOS PCT: 5 %
Neutro Abs: 2.6 10*3/uL (ref 1.7–7.7)
Neutrophils Relative %: 59 %
PLATELETS: 277 10*3/uL (ref 150–400)
RBC: 4.5 MIL/uL (ref 3.87–5.11)
RDW: 13.8 % (ref 11.5–15.5)
WBC: 4.5 10*3/uL (ref 4.0–10.5)

## 2017-04-04 LAB — URINALYSIS, MICROSCOPIC (REFLEX)

## 2017-04-04 LAB — WET PREP, GENITAL
Clue Cells Wet Prep HPF POC: NONE SEEN
SPERM: NONE SEEN
TRICH WET PREP: NONE SEEN
YEAST WET PREP: NONE SEEN

## 2017-04-04 LAB — PREGNANCY, URINE: Preg Test, Ur: NEGATIVE

## 2017-04-04 LAB — URINALYSIS, ROUTINE W REFLEX MICROSCOPIC
BILIRUBIN URINE: NEGATIVE
Glucose, UA: NEGATIVE mg/dL
KETONES UR: NEGATIVE mg/dL
Leukocytes, UA: NEGATIVE
NITRITE: NEGATIVE
PH: 6 (ref 5.0–8.0)
Protein, ur: 30 mg/dL — AB
SPECIFIC GRAVITY, URINE: 1.025 (ref 1.005–1.030)

## 2017-04-04 MED ORDER — IBUPROFEN 400 MG PO TABS
600.0000 mg | ORAL_TABLET | Freq: Once | ORAL | Status: AC
  Administered 2017-04-04: 10:00:00 600 mg via ORAL
  Filled 2017-04-04: qty 1

## 2017-04-04 MED ORDER — MEDROXYPROGESTERONE ACETATE 10 MG PO TABS
10.0000 mg | ORAL_TABLET | Freq: Every day | ORAL | Status: DC
  Administered 2017-04-04: 10 mg via ORAL
  Filled 2017-04-04: qty 1

## 2017-04-04 MED ORDER — SODIUM CHLORIDE 0.9 % IV BOLUS (SEPSIS)
1000.0000 mL | Freq: Once | INTRAVENOUS | Status: AC
  Administered 2017-04-04: 1000 mL via INTRAVENOUS

## 2017-04-04 MED ORDER — MEDROXYPROGESTERONE ACETATE 10 MG PO TABS: 10.0000 mg | ORAL_TABLET | Freq: Every day | ORAL | 0 refills | Status: AC

## 2017-04-04 MED ORDER — IBUPROFEN 600 MG PO TABS: 600.0000 mg | ORAL_TABLET | Freq: Four times a day (QID) | ORAL | 0 refills | Status: AC | PRN

## 2017-04-04 NOTE — ED Notes (Signed)
Pt unable to void at this time to provide sample.

## 2017-04-04 NOTE — ED Triage Notes (Addendum)
Pt reports her menstrual cycle began September 24th, so she decided to come to the ED for evaluation. Mild abdominal cramping. Denies history of same. Pt seen by PCP, referred to Ob - Ob referred back to PCP. Depo shot last taken on 7/20.

## 2017-04-04 NOTE — ED Provider Notes (Signed)
[facility] EMERGENCY DEPARTMENT Provider Note   CSN: [id number] Arrival date & time: 04/04/17  0810     History   Chief Complaint Chief Complaint  Patient presents with  . Vaginal Bleeding    HPI Alicia Tanner is a 31 y.o. female.  Pt presents to the ED today with vaginal bleeding.  She started her period on 9/24 and it has continued.  She had Depo last on 7/20.  The pt does c/o mild abdominal cramping.  Pt has a hx of this in the past.  She had a pelvic us on 08/13/16 which showed a normal endometrium.   Pt has an OB whom she called on 10/15 for an appointment.  The secretary was supposed to call pt with an appointment, but the office has not yet done so.        History reviewed. No pertinent past medical history.  There are no active problems to display for this patient.   Past Surgical History:  Procedure Laterality Date  . BREAST BIOPSY    . CESAREAN SECTION    . FOOT SURGERY    . TONSILLECTOMY      OB History    No data available       Home Medications    Prior to Admission medications   Medication Sig Start Date End Date Taking? Authorizing Provider  Levothyroxine Sodium (SYNTHROID PO) Take by mouth.   Yes [provider]  PHENTERMINE HCL PO Take by mouth.   Yes [provider]  ibuprofen (ADVIL,MOTRIN) 600 MG tablet Take 1 tablet (600 mg total) by mouth every 6 (six) hours as needed. 04/04/17   Jacalyn LefevreHaviland, Lynann Demetrius, MD  medroxyPROGESTERone (PROVERA) 10 MG tablet Take 1 tablet (10 mg total) by mouth daily. 04/05/17   Jacalyn LefevreHaviland, Chas Axel, MD  omeprazole (PRILOSEC) 20 MG capsule Take 1 capsule (20 mg total) by mouth daily. 03/01/15   Palumbo, April, MD    Family History History reviewed. No pertinent family history.  Social History Social History  Substance Use Topics  . Smoking status: Never Smoker  . Smokeless tobacco: Never Used  . Alcohol use No     Allergies   Penicillins   Review of Systems Review of Systems    Gastrointestinal: Positive for abdominal pain.  Genitourinary: Positive for vaginal bleeding.  All other systems reviewed and are negative.    Physical Exam Updated Vital Signs BP 116/80 (BP Location: Left Arm)   Pulse 78   Temp 98.3 F (36.8 C) (Oral)   Resp 16   Ht 5\' 2"  (1.575 m)   Wt 80.7 kg (178 lb)   LMP 03/09/2017   SpO2 100%   BMI 32.56 kg/m   Physical Exam  Constitutional: She is oriented to person, place, and time. She appears well-developed and well-nourished.  HENT:  Head: Normocephalic and atraumatic.  Right Ear: External ear normal.  Left Ear: External ear normal.  Nose: Nose normal.  Mouth/Throat: Oropharynx is clear and moist.  Eyes: Pupils are equal, round, and reactive to light. Conjunctivae and EOM are normal.  Neck: Normal range of motion. Neck supple.  Cardiovascular: Normal rate, regular rhythm, normal heart sounds and intact distal pulses.   Pulmonary/Chest: Effort normal and breath sounds normal.  Abdominal: Soft. Bowel sounds are normal. There is tenderness in the suprapubic area.  Genitourinary: Uterus is tender. Cervix exhibits no discharge. Right adnexum displays no tenderness. Left adnexum displays no tenderness. There is bleeding in the vagina.  Musculoskeletal: Normal range of  motion.  Neurological: She is alert and oriented to person, place, and time.  Skin: Skin is warm. Capillary refill takes less than 2 seconds.  Psychiatric: She has a normal mood and affect. Her behavior is normal. Judgment and thought content normal.  Nursing note and vitals reviewed.    ED Treatments / Results  Labs (all labs ordered are listed, but only abnormal results are displayed) Labs Reviewed  WET PREP, GENITAL - Abnormal; Notable for the following:       Result Value   WBC, Wet Prep HPF POC FEW (*)    All other components within normal limits  URINALYSIS, ROUTINE W REFLEX MICROSCOPIC - Abnormal; Notable for the following:    Color, Urine AMBER (*)     APPearance CLOUDY (*)    Hgb urine dipstick LARGE (*)    Protein, ur 30 (*)    All other components within normal limits  URINALYSIS, MICROSCOPIC (REFLEX) - Abnormal; Notable for the following:    Bacteria, UA MANY (*)    Squamous Epithelial / LPF 6-30 (*)    All other components within normal limits  BASIC METABOLIC PANEL  CBC WITH DIFFERENTIAL/PLATELET  PREGNANCY, URINE  GC/CHLAMYDIA PROBE AMP ([place]) NOT AT [facility]    EKG  EKG Interpretation None       Radiology No results found.  Procedures Procedures (including critical care time)  Medications Ordered in ED Medications  sodium chloride 0.9 % bolus 1,000 mL (not administered)  medroxyPROGESTERone (PROVERA) tablet 10 mg (not administered)  ibuprofen (ADVIL,MOTRIN) tablet 600 mg (not administered)     Initial Impression / Assessment and Plan / ED Course  I have reviewed the triage vital signs and the nursing notes.  Pertinent labs & imaging results that were available during my care of the patient were reviewed by me and considered in my medical decision making (see chart for details).   Pt is feeling better.  Sx likely related to depo withdrawal.  The pt will be started on po provera.  She is encouraged to call her obgyn on Monday to make an appt.  She knows to return if worse.   Final Clinical Impressions(s) / ED Diagnoses   Final diagnoses:  Menorrhagia with irregular cycle    New Prescriptions New Prescriptions   IBUPROFEN (ADVIL,MOTRIN) 600 MG TABLET    Take 1 tablet (600 mg total) by mouth every 6 (six) hours as needed.   MEDROXYPROGESTERONE (PROVERA) 10 MG TABLET    Take 1 tablet (10 mg total) by mouth daily.     Jacalyn Lefevre, MD 04/04/17 [phone]

## 2017-04-04 NOTE — ED Notes (Signed)
ED Provider at bedside. 

## 2017-04-04 NOTE — ED Notes (Signed)
Pelvic Cart at bedside 

## 2017-04-06 LAB — GC/CHLAMYDIA PROBE AMP (~~LOC~~) NOT AT ARMC
Chlamydia: NEGATIVE
Neisseria Gonorrhea: NEGATIVE

## 2021-09-14 ENCOUNTER — Emergency Department (HOSPITAL_BASED_OUTPATIENT_CLINIC_OR_DEPARTMENT_OTHER)
Admission: EM | Admit: 2021-09-14 | Discharge: 2021-09-14 | Disposition: A | Discharge status: Home / Self Care | Payer: BC Managed Care – PPO | Attending: Emergency Medicine | Admitting: Emergency Medicine

## 2021-09-14 ENCOUNTER — Emergency Department (HOSPITAL_BASED_OUTPATIENT_CLINIC_OR_DEPARTMENT_OTHER): Payer: BC Managed Care – PPO

## 2021-09-14 ENCOUNTER — Encounter (HOSPITAL_BASED_OUTPATIENT_CLINIC_OR_DEPARTMENT_OTHER): Payer: Self-pay | Admitting: Emergency Medicine

## 2021-09-14 ENCOUNTER — Other Ambulatory Visit: Payer: Self-pay

## 2021-09-14 DIAGNOSIS — O26892 Other specified pregnancy related conditions, second trimester: Secondary | ICD-10-CM | POA: Insufficient documentation

## 2021-09-14 DIAGNOSIS — Z3A16 16 weeks gestation of pregnancy: Secondary | ICD-10-CM | POA: Insufficient documentation

## 2021-09-14 DIAGNOSIS — R1031 Right lower quadrant pain: Secondary | ICD-10-CM | POA: Diagnosis not present

## 2021-09-14 DIAGNOSIS — R10813 Right lower quadrant abdominal tenderness: Secondary | ICD-10-CM

## 2021-09-14 DIAGNOSIS — O09522 Supervision of elderly multigravida, second trimester: Secondary | ICD-10-CM | POA: Diagnosis not present

## 2021-09-14 LAB — COMPREHENSIVE METABOLIC PANEL
ALT: 33 U/L (ref 0–44)
AST: 19 U/L (ref 15–41)
Albumin: 3.4 g/dL — ABNORMAL LOW (ref 3.5–5.0)
Alkaline Phosphatase: 73 U/L (ref 38–126)
Anion gap: 9 (ref 5–15)
BUN: 10 mg/dL (ref 6–20)
CO2: 23 mmol/L (ref 22–32)
Calcium: 9.7 mg/dL (ref 8.9–10.3)
Chloride: 103 mmol/L (ref 98–111)
Creatinine, Ser: 0.57 mg/dL (ref 0.44–1.00)
GFR, Estimated: >60 mL/min (ref >60)
Glucose, Bld: 89 mg/dL (ref 70–99)
Potassium: 3.9 mmol/L (ref 3.5–5.1)
Sodium: 135 mmol/L (ref 135–145)
Total Bilirubin: 0.3 mg/dL (ref 0.3–1.2)
Total Protein: 7.3 g/dL (ref 6.5–8.1)

## 2021-09-14 LAB — CBC WITH DIFFERENTIAL/PLATELET
Abs Immature Granulocytes: 0.05 10*3/uL (ref 0.00–0.07)
Basophils Absolute: 0 10*3/uL (ref 0.0–0.1)
Basophils Relative: 0 %
Eosinophils Absolute: 0.1 10*3/uL (ref 0.0–0.5)
Eosinophils Relative: 1 %
HCT: 36.6 % (ref 36.0–46.0)
Hemoglobin: 12.4 g/dL (ref 12.0–15.0)
Immature Granulocytes: 1 %
Lymphocytes Relative: 19 %
Lymphs Abs: 1.6 10*3/uL (ref 0.7–4.0)
MCH: 28.9 pg (ref 26.0–34.0)
MCHC: 33.9 g/dL (ref 30.0–36.0)
MCV: 85.3 fL (ref 80.0–100.0)
Monocytes Absolute: 0.4 10*3/uL (ref 0.1–1.0)
Monocytes Relative: 5 %
Neutro Abs: 6.3 10*3/uL (ref 1.7–7.7)
Neutrophils Relative %: 74 %
Platelets: 291 10*3/uL (ref 150–400)
RBC: 4.29 MIL/uL (ref 3.87–5.11)
RDW: 14.7 % (ref 11.5–15.5)
WBC: 8.3 10*3/uL (ref 4.0–10.5)
nRBC: 0 % (ref 0.0–0.2)

## 2021-09-14 LAB — URINALYSIS, ROUTINE W REFLEX MICROSCOPIC
Bilirubin Urine: NEGATIVE
Glucose, UA: NEGATIVE mg/dL
Hgb urine dipstick: NEGATIVE
Ketones, ur: NEGATIVE mg/dL
Leukocytes,Ua: NEGATIVE
Nitrite: NEGATIVE
Protein, ur: NEGATIVE mg/dL
Specific Gravity, Urine: 1.02 (ref 1.005–1.030)
pH: 6.5 (ref 5.0–8.0)

## 2021-09-14 IMAGING — MR MR ABDOMEN W/O CM
7 of 8 series · 37 of 48 positions shown · non-contrast
Comparison: None.

CLINICAL DATA: Evaluate for acute appendicitis.

EXAM:
MRI ABDOMEN AND PELVIS WITHOUT CONTRAST
TECHNIQUE: Multiplanar multisequence MR imaging of the abdomen and pelvis was
performed. No intravenous contrast was administered.

[Series 3: T2 · coronal · 7.0mm · 1.25mm/px · 5 of 30 slices shown (1 of 5)]
[im 1/30]
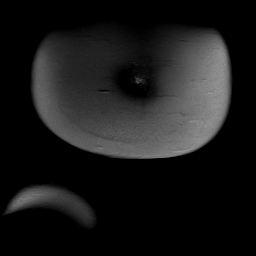
[im 8/30]
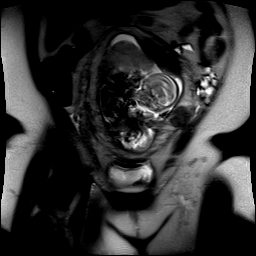
[im 15/30]
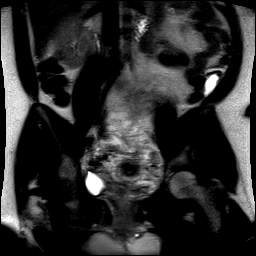
[im 22/30]
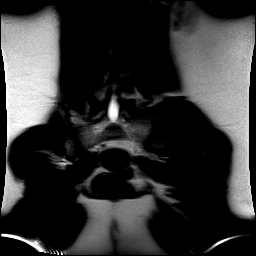
[im 30/30]
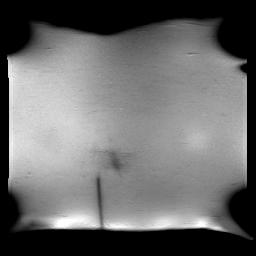

[Series 4: T2 · axial · 6.0mm · 1.17mm/px · 1 of 3 slices shown (2 of 5)]
[im 1/3]
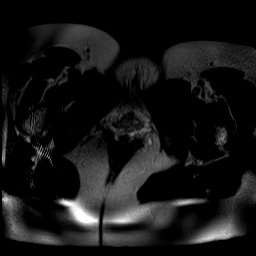

[Series 5: T2 fat-sat · coronal · 7.0mm · 1.25mm/px · 5 of 30 slices shown (1 of 2)]
[im 1/30]
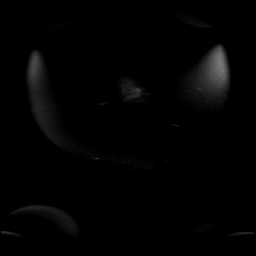
[im 8/30]
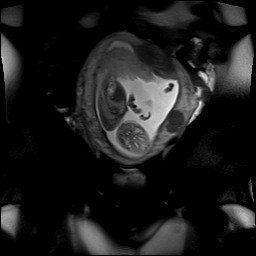
[im 15/30]
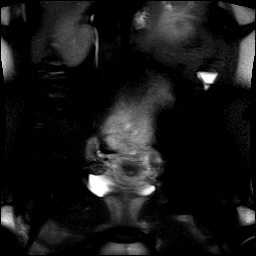
[im 22/30]
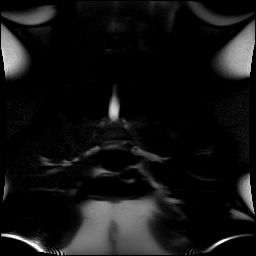
[im 30/30]
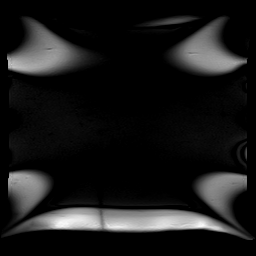

[Series 6: T2 · axial · 6.0mm · 1.17mm/px · z∈[-121,+171]mm · 7 of 40 slices shown (3 of 5)]
[im 1/40]
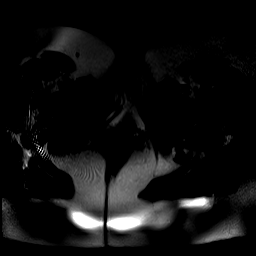
[im 7/40]
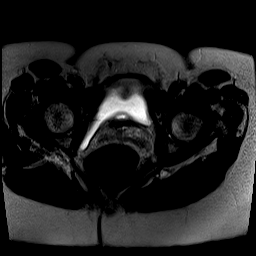
[im 14/40]
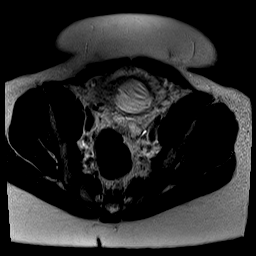
[im 20/40]
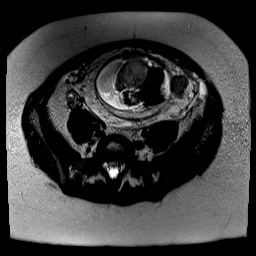
[im 27/40]
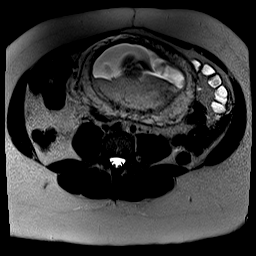
[im 33/40]
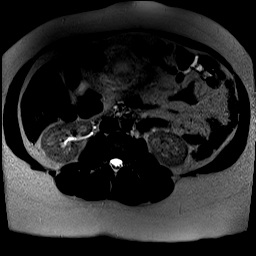
[im 40/40]
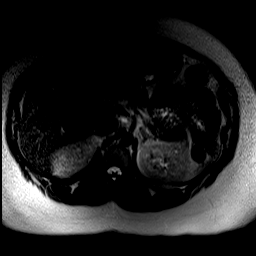

[Series 7: T2 fat-sat · axial · 6.0mm · 1.17mm/px · z∈[-121,+171]mm · 8 of 40 slices shown (2 of 2)]
[im 1/40]
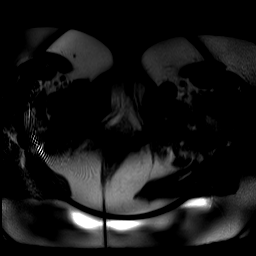
[im 6/40]
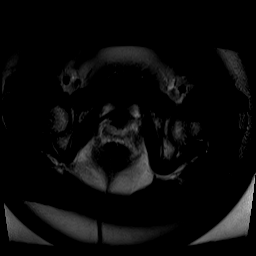
[im 12/40]
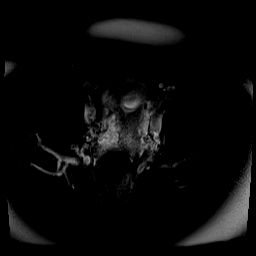
[im 17/40]
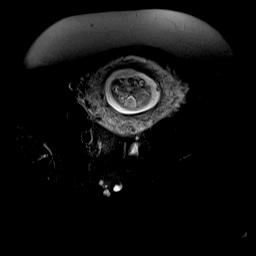
[im 23/40]
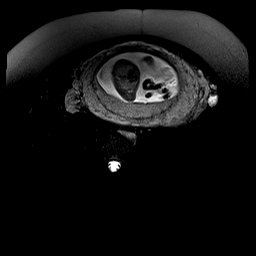
[im 28/40]
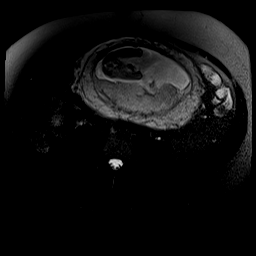
[im 34/40]
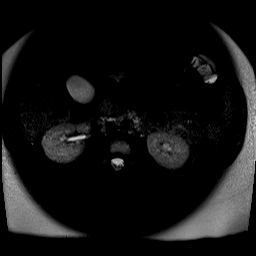
[im 40/40]
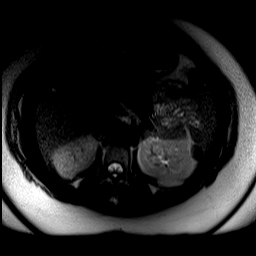

[Series 8: T2 · axial · 6.0mm · 0.59mm/px · z∈[-121,+171]mm · 8 of 40 slices shown (4 of 5)]
[im 1/40]
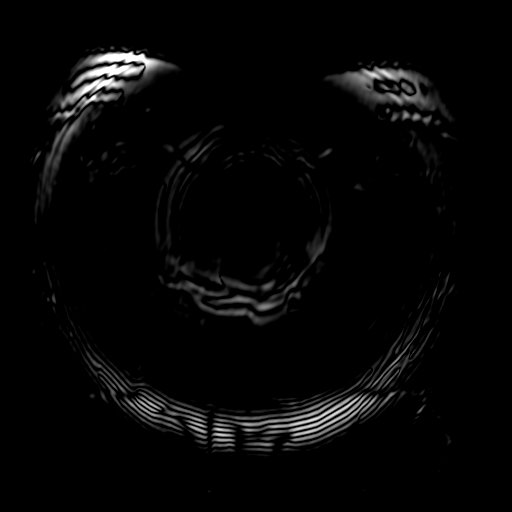
[im 6/40]
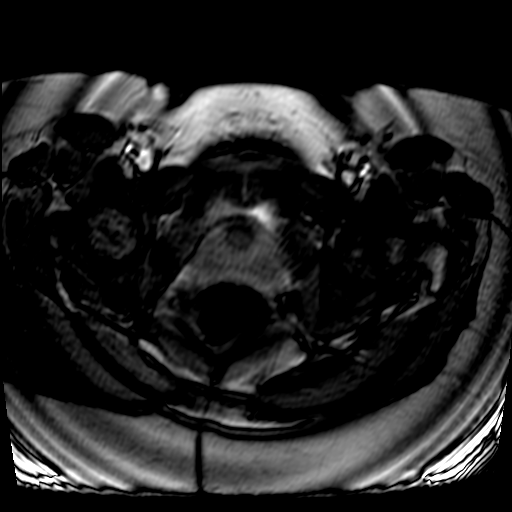
[im 12/40]
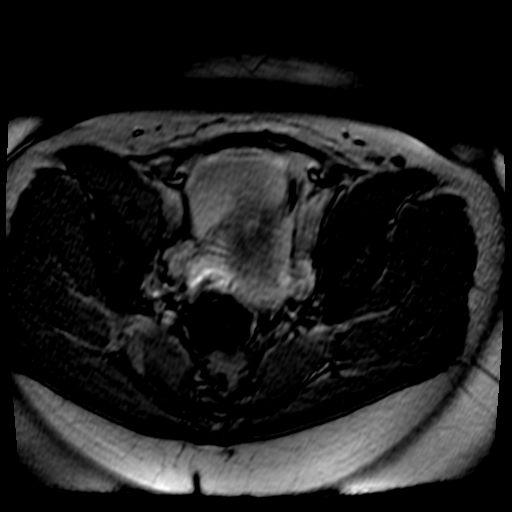
[im 17/40]
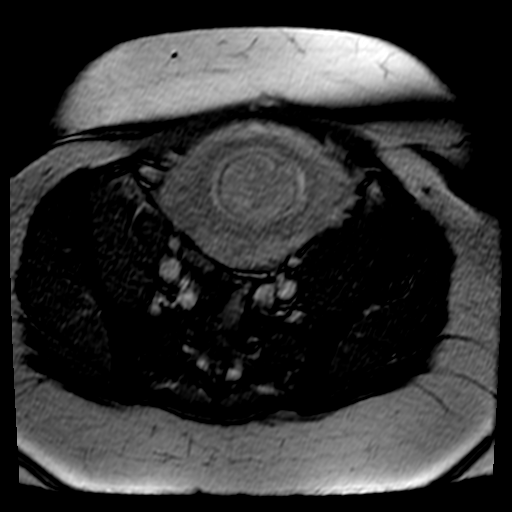
[im 23/40]
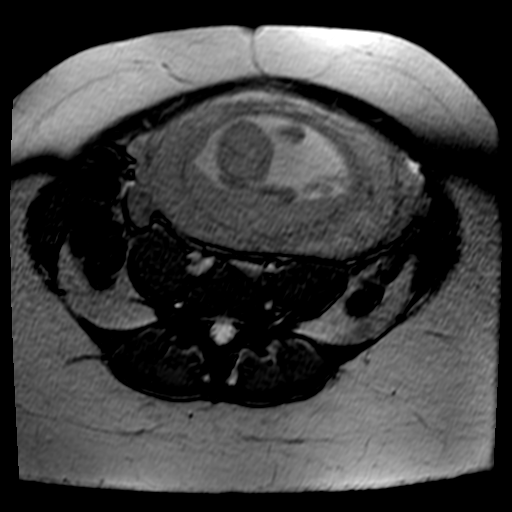
[im 28/40]
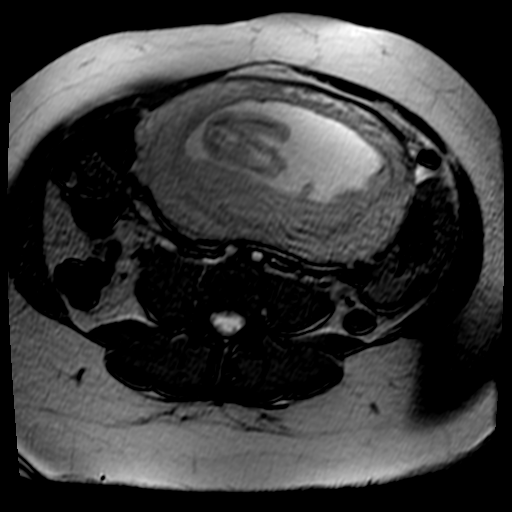
[im 34/40]
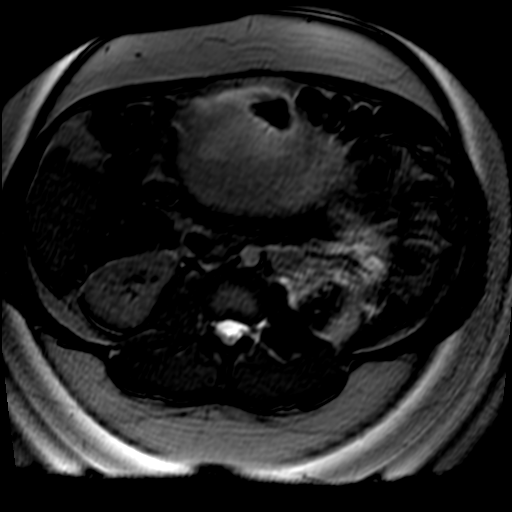
[im 40/40]
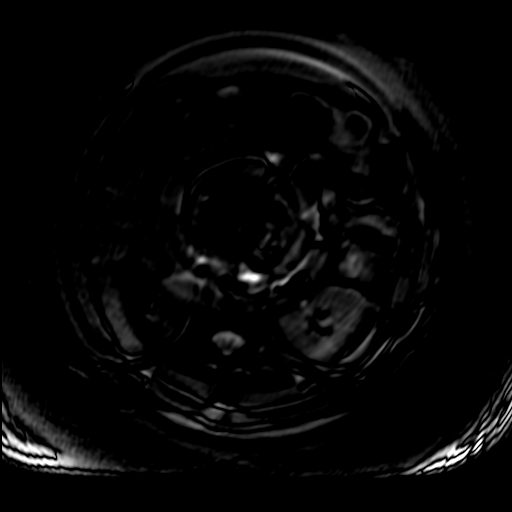

[Series 10: T2 · coronal · 7.0mm · 0.62mm/px · 3 of 30 slices shown (5 of 5)]
[im 1/30]
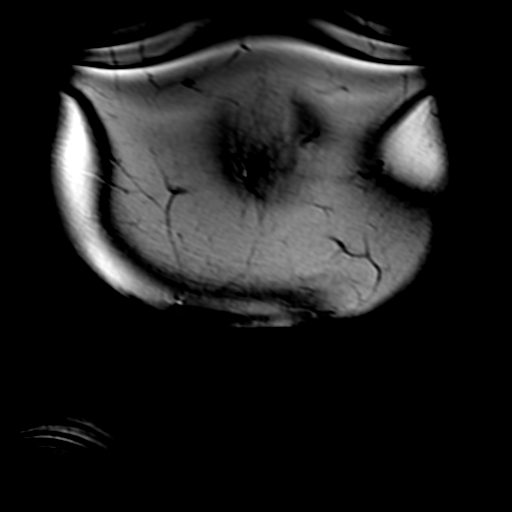
[im 6/30]
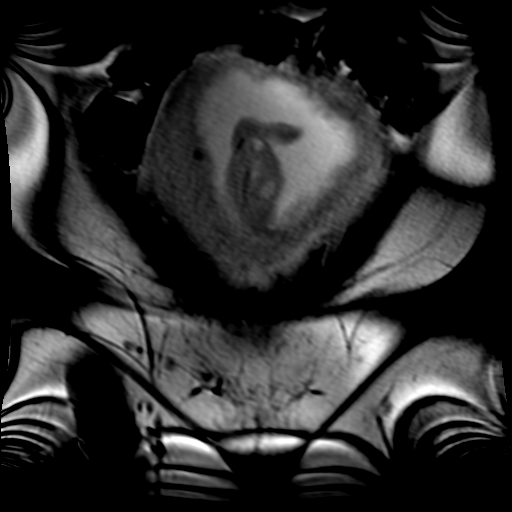
[im 12/30]
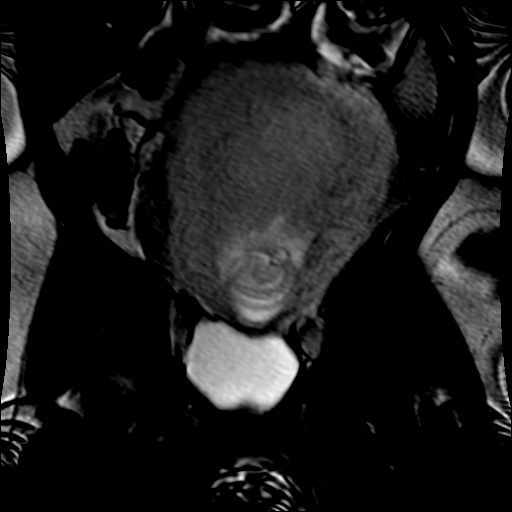

[37 of 48 positions shown; findings below may reference images not displayed]

FINDINGS: COMBINED FINDINGS FOR BOTH MR ABDOMEN AND PELVIS

Lower chest: Not visualized

Hepatobiliary: The visualized portions of the liver are
unremarkable. Gallbladder appears normal. No significant bile duct
dilatation.

Pancreas:  Not well visualized.

Spleen:  Not included within the field of view.

Adrenals/Urinary Tract: No masses identified. No evidence of
hydronephrosis.

Stomach/Bowel: The visualized bowel loops are unremarkable. No signs
of bowel wall thickening, inflammation, or distension. The appendix
is not confidently identified with a high degree of certainty. No
pericecal inflammation identified however. No discrete fluid
collections identified to suggest abscess.

Vascular/Lymphatic: No pathologically enlarged lymph nodes
identified. No abdominal aortic aneurysm demonstrated.

Reproductive: Gravid uterus compatible with 16 weeks gestation.

Other: There is no free fluid or focal fluid collections identified.

Musculoskeletal: No suspicious bone lesions identified.
IMPRESSION: 1. The appendix is not confidently identified with a high degree of
certainty. No pericecal inflammation identified however. No discrete
fluid collections identified to suggest abscess.
2. Gravid uterus compatible with 16 weeks gestation.

## 2021-09-14 IMAGING — MR MR PELVIS W/O CM
7 of 8 series · 37 of 48 positions shown · non-contrast
Comparison: None.

CLINICAL DATA: Evaluate for acute appendicitis.

EXAM:
MRI ABDOMEN AND PELVIS WITHOUT CONTRAST
TECHNIQUE: Multiplanar multisequence MR imaging of the abdomen and pelvis was
performed. No intravenous contrast was administered.

[Series 3: T2 · coronal · 7.0mm · 1.25mm/px · 5 of 30 slices shown (1 of 5)]
[im 1/30]
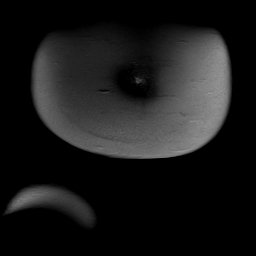
[im 8/30]
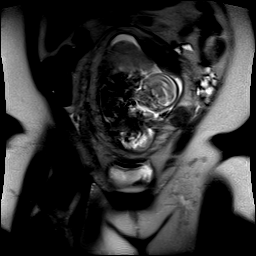
[im 15/30]
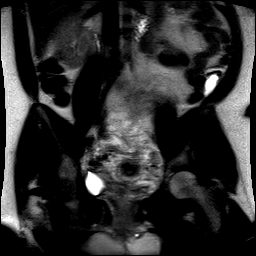
[im 22/30]
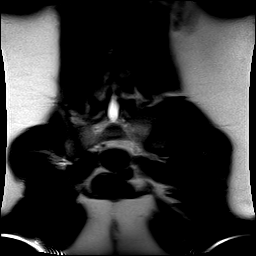
[im 30/30]
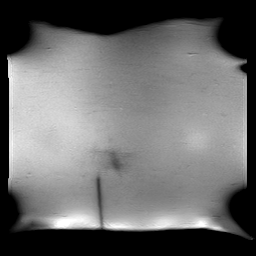

[Series 4: T2 · axial · 6.0mm · 1.17mm/px · 1 of 3 slices shown (2 of 5)]
[im 1/3]
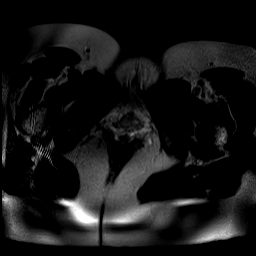

[Series 5: T2 fat-sat · coronal · 7.0mm · 1.25mm/px · 5 of 30 slices shown (1 of 2)]
[im 1/30]
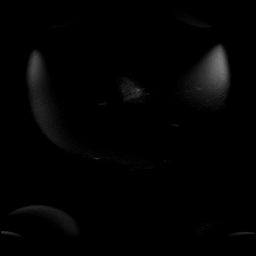
[im 8/30]
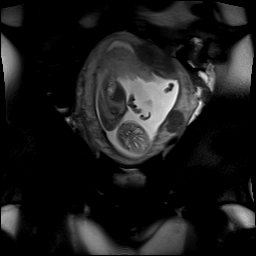
[im 15/30]
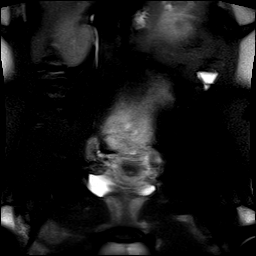
[im 22/30]
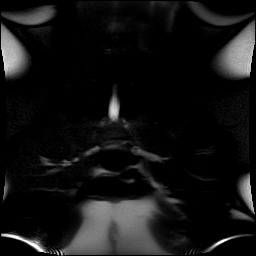
[im 30/30]
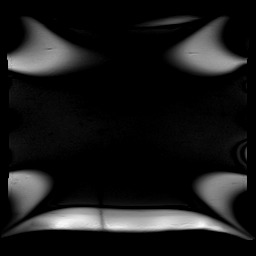

[Series 6: T2 · axial · 6.0mm · 1.17mm/px · z∈[-121,+171]mm · 7 of 40 slices shown (3 of 5)]
[im 1/40]
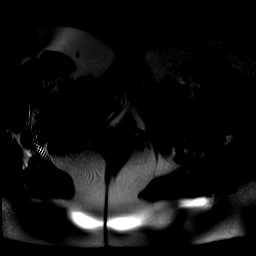
[im 7/40]
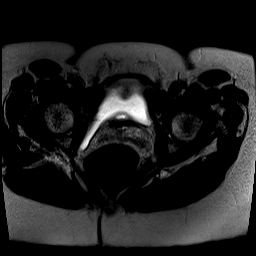
[im 14/40]
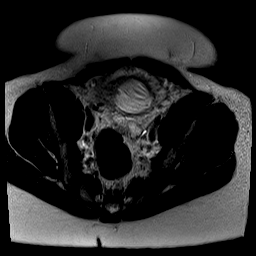
[im 20/40]
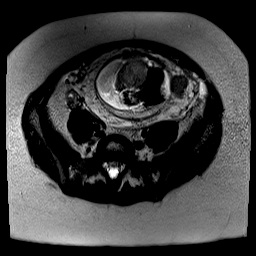
[im 27/40]
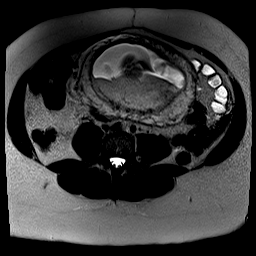
[im 33/40]
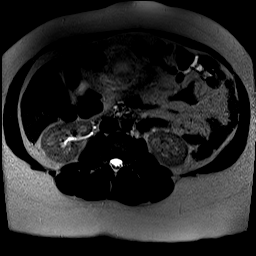
[im 40/40]
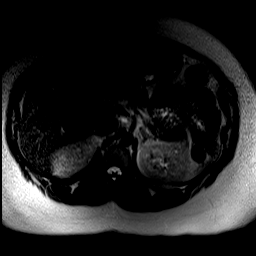

[Series 7: T2 fat-sat · axial · 6.0mm · 1.17mm/px · z∈[-121,+171]mm · 8 of 40 slices shown (2 of 2)]
[im 1/40]
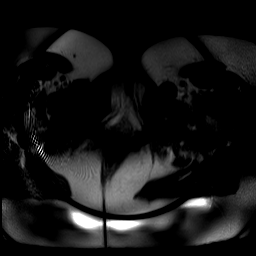
[im 6/40]
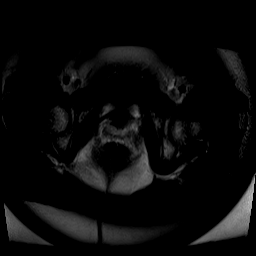
[im 12/40]
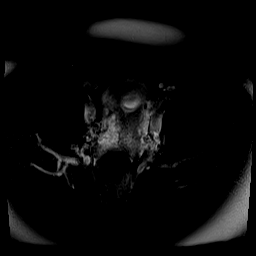
[im 17/40]
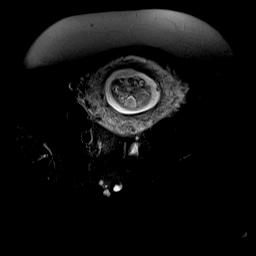
[im 23/40]
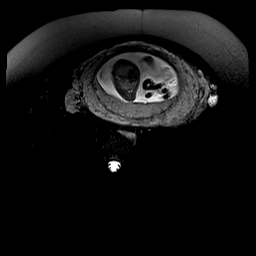
[im 28/40]
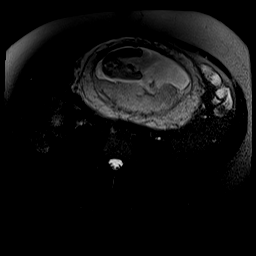
[im 34/40]
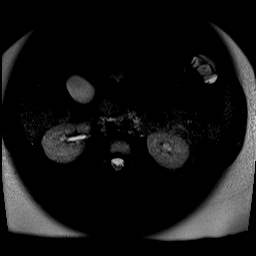
[im 40/40]
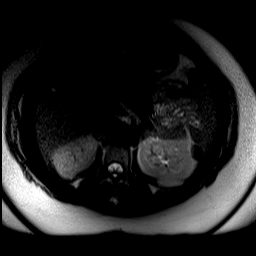

[Series 8: T2 · axial · 6.0mm · 0.59mm/px · z∈[-121,+171]mm · 8 of 40 slices shown (4 of 5)]
[im 1/40]
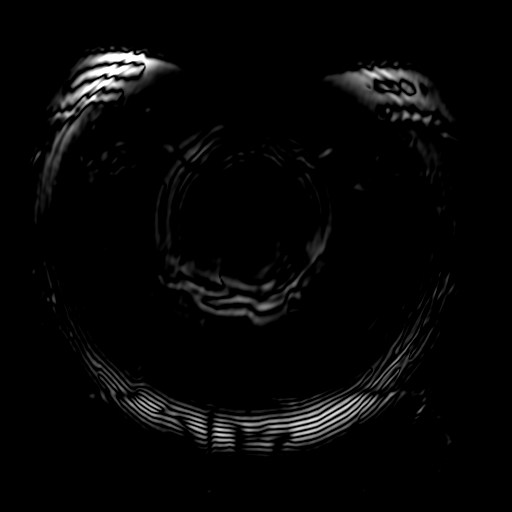
[im 6/40]
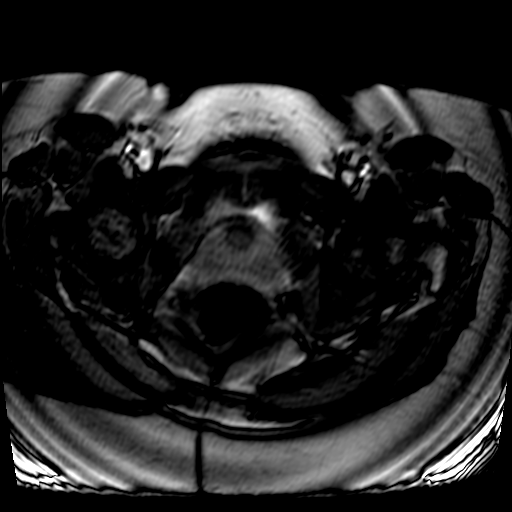
[im 12/40]
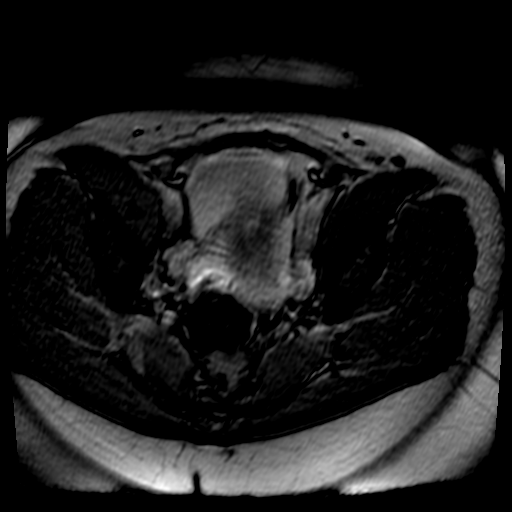
[im 17/40]
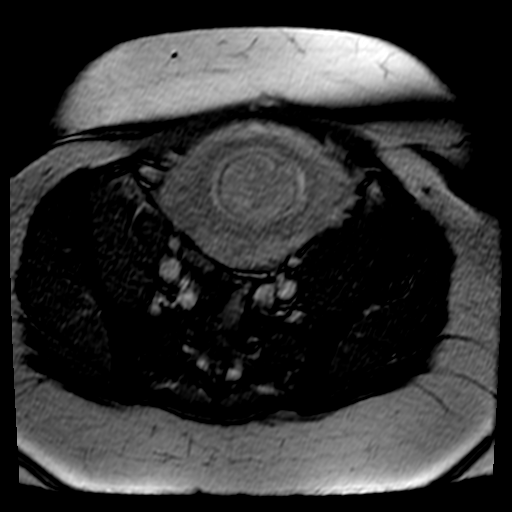
[im 23/40]
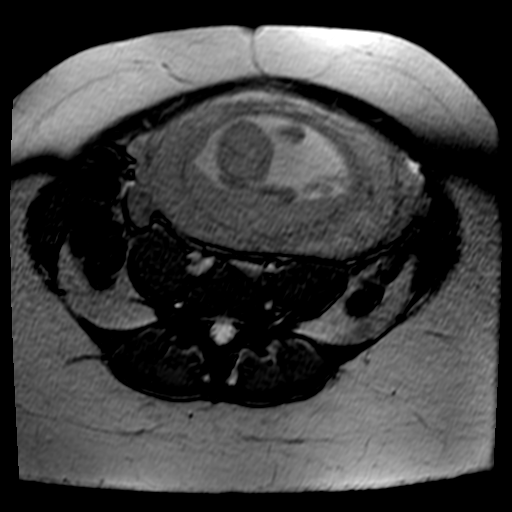
[im 28/40]
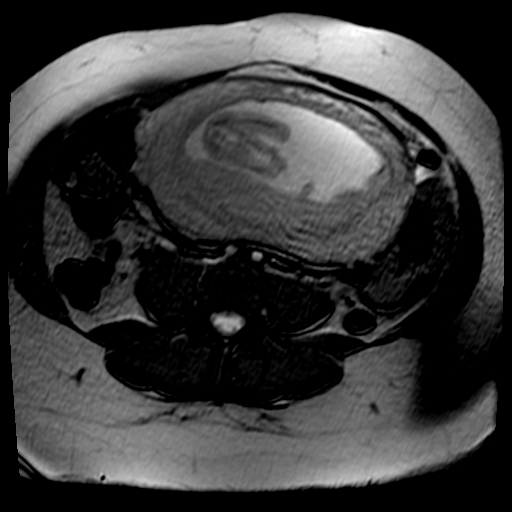
[im 34/40]
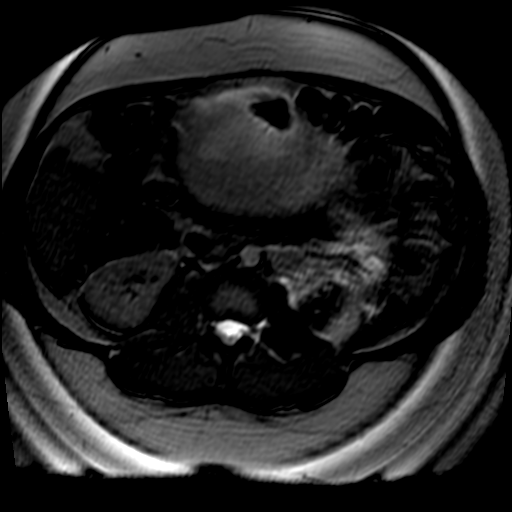
[im 40/40]
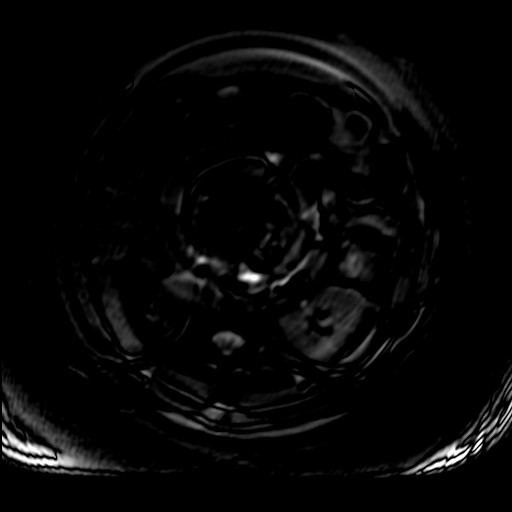

[Series 10: T2 · coronal · 7.0mm · 0.62mm/px · 3 of 30 slices shown (5 of 5)]
[im 1/30]
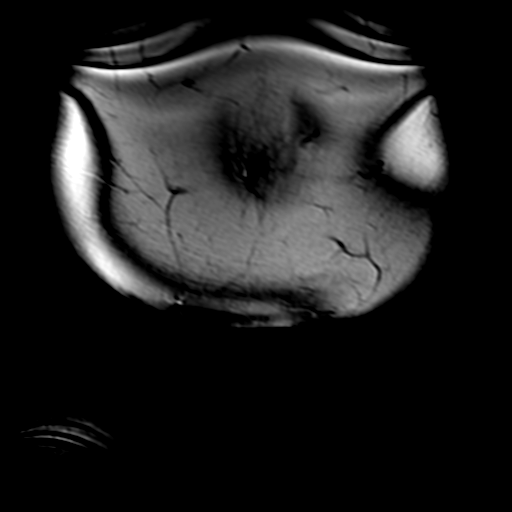
[im 6/30]
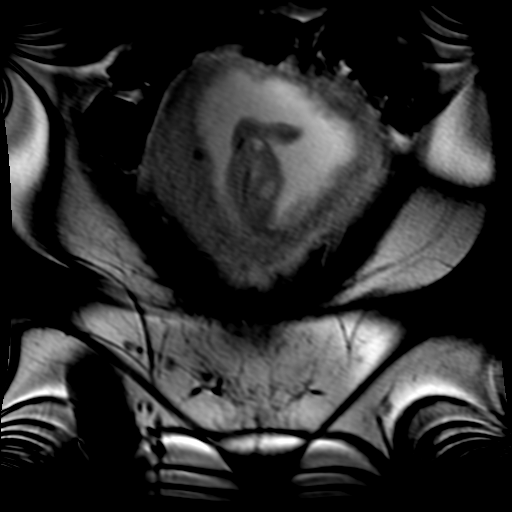
[im 12/30]
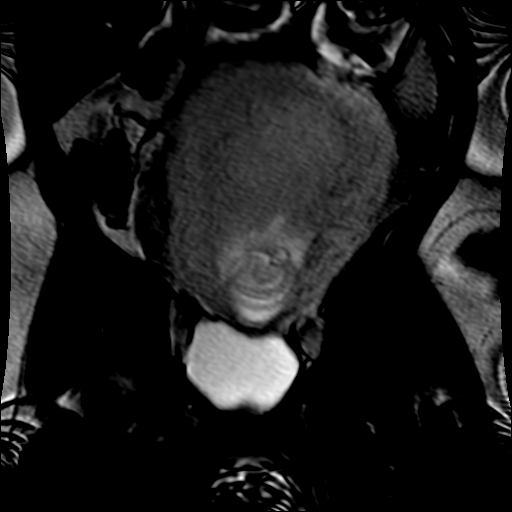

[37 of 48 positions shown; findings below may reference images not displayed]

FINDINGS: COMBINED FINDINGS FOR BOTH MR ABDOMEN AND PELVIS

Lower chest: Not visualized

Hepatobiliary: The visualized portions of the liver are
unremarkable. Gallbladder appears normal. No significant bile duct
dilatation.

Pancreas:  Not well visualized.

Spleen:  Not included within the field of view.

Adrenals/Urinary Tract: No masses identified. No evidence of
hydronephrosis.

Stomach/Bowel: The visualized bowel loops are unremarkable. No signs
of bowel wall thickening, inflammation, or distension. The appendix
is not confidently identified with a high degree of certainty. No
pericecal inflammation identified however. No discrete fluid
collections identified to suggest abscess.

Vascular/Lymphatic: No pathologically enlarged lymph nodes
identified. No abdominal aortic aneurysm demonstrated.

Reproductive: Gravid uterus compatible with 16 weeks gestation.

Other: There is no free fluid or focal fluid collections identified.

Musculoskeletal: No suspicious bone lesions identified.
IMPRESSION: 1. The appendix is not confidently identified with a high degree of
certainty. No pericecal inflammation identified however. No discrete
fluid collections identified to suggest abscess.
2. Gravid uterus compatible with 16 weeks gestation.

## 2021-09-14 MED ORDER — LACTATED RINGERS IV BOLUS
1000.0000 mL | Freq: Once | INTRAVENOUS | Status: AC
  Administered 2021-09-14: 1000 mL via INTRAVENOUS

## 2021-09-14 NOTE — Discharge Instructions (Signed)
There was no obvious acute abnormality on your MRI.  Unfortunately they also did not visualize the appendix.  This does not fully rule out appendicitis.  If your pain worsens you get a fever or are unable to eat or drink then please return to the emergency department for repeat evaluation. ?

## 2021-09-14 NOTE — ED Provider Notes (Signed)
?[facility] EMERGENCY DEPARTMENT ?Provider Note ? ? ?CSN: [id number] ?Arrival date & time: 09/14/21  1053 ? ?  ? ?History ? ?Chief Complaint  ?Patient presents with  ? Abdominal Pain  ? ? ?[name] is a 36 y.o. female. ? ?HPI ? ?  ?36 year old female comes in with chief complaint of abdominal pain. ?Patient is G5 P3 with 1 miscarriage.  She indicates that she started having right-sided abdominal pain at 1 AM yesterday.  The pain has been constant since then.  Pain is located over the right lower quadrant and described as sharp/stabbing.  The pain is not cramping or contraction type pain.  Intensity of the pain is 5 out of 10 and she has not taken any medicine for it.  She denies any associated UTI-like symptoms, vaginal discharge or bleeding.  She denies any trauma.  Patient denies any history of pelvic disorder, her surgical history includes C-section. ? ? ?Home Medications ?Prior to Admission medications   ?Medication Sig Start Date End Date Taking? Authorizing Provider  ?ibuprofen (ADVIL,MOTRIN) 600 MG tablet Take 1 tablet (600 mg total) by mouth every 6 (six) hours as needed. 04/04/17   Jacalyn Lefevre, MD  ?Levothyroxine Sodium (SYNTHROID PO) Take by mouth.    [provider]  ?medroxyPROGESTERone (PROVERA) 10 MG tablet Take 1 tablet (10 mg total) by mouth daily. 04/05/17   Jacalyn Lefevre, MD  ?omeprazole (PRILOSEC) 20 MG capsule Take 1 capsule (20 mg total) by mouth daily. 03/01/15   Palumbo, April, MD  ?PHENTERMINE HCL PO Take by mouth.    [provider]  ?   ? ?Allergies    ?Penicillins   ? ?Review of Systems   ?Review of Systems  ?All other systems reviewed and are negative. ? ?Physical Exam ?Updated Vital Signs ?BP 114/70 (BP Location: Right Arm)   Pulse 81   Temp 98.4 ?F (36.9 ?C) (Oral)   Resp 17   Ht 5\' 2"  (1.575 m)   Wt 90.7 kg   LMP 05/19/2021   SpO2 100%   BMI 36.58 kg/m?  ?Physical Exam ?Vitals and nursing note reviewed.  ?Constitutional:   ?   Appearance:  She is well-developed.  ?HENT:  ?   Head: Atraumatic.  ?Cardiovascular:  ?   Rate and Rhythm: Normal rate.  ?Pulmonary:  ?   Effort: Pulmonary effort is normal.  ?Abdominal:  ?   Tenderness: There is abdominal tenderness in the right lower quadrant. There is guarding. There is no rebound.  ?Musculoskeletal:  ?   Cervical back: Normal range of motion and neck supple.  ?Skin: ?   General: Skin is warm and dry.  ?Neurological:  ?   Mental Status: She is alert and oriented to person, place, and time.  ? ? ?ED Results / Procedures / Treatments   ?Labs ?(all labs ordered are listed, but only abnormal results are displayed) ?Labs Reviewed  ?COMPREHENSIVE METABOLIC PANEL - Abnormal; Notable for the following components:  ?    Result Value  ? Albumin 3.4 (*)   ? All other components within normal limits  ?CBC WITH DIFFERENTIAL/PLATELET  ?URINALYSIS, ROUTINE W REFLEX MICROSCOPIC  ? ? ?EKG ?None ? ?Radiology ?No results found. ? ?Procedures ?Procedures  ? ? ?Medications Ordered in ED ?Medications  ?lactated ringers bolus 1,000 mL ( Intravenous Stopped 09/14/21 1352)  ? ? ?ED Course/ Medical Decision Making/ A&P ?Clinical Course as of 09/14/21 1514  ?Sat Sep 14, 2021  ?1223 Patient has declined Tylenol for now.  We will continue to monitor and reassess. [AN]  ?1513 CBC with Differential ?Patient's white count is normal.  Her UA has no pyuria.  On reassessment, continues to have abdominal tenderness therefore we have proceeded with the plan to get MRI.  MRI still not completed, at this time I will have to sign out patient's care to incoming team.  Patient is comfortable with the plan.  She still does not want any thing for pain. [AN]  ?  ?Clinical Course User Index ?[AN] Derwood Kaplan, MD  ? ?                        ?Medical Decision Making ?Amount and/or Complexity of Data Reviewed ?Labs: ordered. ?Radiology: ordered. ? ? ?This patient presents to the ED with chief complaint(s) of right-sided abdominal pain with pertinent past  medical history of being [redacted] weeks pregnant at the moment, will be history is G5, P3 with 1 miscarriage. ? ?The differential diagnosis includes : ?Ectopic pregnancy, round ligament pain, acute appendicitis, kidney stones, internal bleeding ? ?The initial plan is to basic labs and repeat abdominal exam. ?Fetal heart tones ordered.  I reviewed patient's OB/GYN visit from 3-21, she has known history of confirmed IUP which makes ectopic pregnancy highly unlikely. ? ? ?Additional history obtained: ?Records reviewed Care Everywhere/External Records ? ? ?Treatment and Reassessment:  ?Patient reassessed.  She continues to have right lower quadrant tenderness.  She is having some guarding. ? ?We will proceed with MRI of the abdomen and pelvis. ? ?Consultation: ?- Consulted or discussed management/test interpretation w/ external professional: Radiologist Dr. Jethro Poling -who clears patient to proceed with MRI of the abdomen and MRI of the pelvis while patient is at the med center, where mobile MRI unit is available.  No need for transfer. ? ? ? ? ?Final Clinical Impression(s) / ED Diagnoses ?Final diagnoses:  ?Right lower quadrant abdominal tenderness without rebound tenderness  ? ? ?Rx / DC Orders ?ED Discharge Orders   ? ? None  ? ?  ? ? ?  ?Derwood Kaplan, MD ?09/14/21 1514 ? ?

## 2021-09-14 NOTE — ED Notes (Signed)
Pt in MRI.

## 2021-09-14 NOTE — ED Provider Notes (Signed)
I seen the patient in signout from Dr. Rhunette Croft, briefly the patient is a 36 year old female with a chief complaints of right lower quadrant abdominal discomfort who is [redacted] weeks pregnant.  Plan to await MRI of the abdomen pelvis to assess for appendicitis. ? ?The MRI is read as negative though they were not able to identify the appendix.  I discussed these results with the patient and she would like to try and go home at this time and will return for any worsening symptoms. ? ?She was able to eat and drink in the ED without issue. ?  Melene Plan, DO ?09/14/21 1846 ? ?

## 2021-09-14 NOTE — ED Notes (Signed)
Spoke with PAL's transfer line with [facility] regarding MRI, per ED MD ?

## 2021-09-14 NOTE — ED Notes (Signed)
Pt aware urine specimen ordered. Pt reports inability to provide specimen at this time. Specimen collection device provided to patient. 

## 2021-09-14 NOTE — ED Notes (Signed)
ED Provider at bedside. 

## 2021-09-14 NOTE — ED Triage Notes (Signed)
Pt arrives pov, steady gait with c/o lower, RLQ cramping starting this am. Pt denies n/v/d or constipation. Pt endorses [redacted] weeks pregnant ?
